# Patient Record
Sex: Male | Born: 1955 | Hispanic: Yes | Marital: Married | Smoking: Never smoker
Health system: Southern US, Community
[De-identification: ages and names within clinical notes are randomized; demographics above are authoritative.]

## PROBLEM LIST (undated history)

## (undated) DIAGNOSIS — G4733 Obstructive sleep apnea (adult) (pediatric): Secondary | ICD-10-CM

## (undated) DIAGNOSIS — E663 Overweight: Secondary | ICD-10-CM

## (undated) DIAGNOSIS — I10 Essential (primary) hypertension: Secondary | ICD-10-CM

## (undated) DIAGNOSIS — R12 Heartburn: Secondary | ICD-10-CM

## (undated) DIAGNOSIS — M1990 Unspecified osteoarthritis, unspecified site: Secondary | ICD-10-CM

## (undated) DIAGNOSIS — I4891 Unspecified atrial fibrillation: Secondary | ICD-10-CM

## (undated) DIAGNOSIS — G4730 Sleep apnea, unspecified: Secondary | ICD-10-CM

## (undated) DIAGNOSIS — R0602 Shortness of breath: Secondary | ICD-10-CM

## (undated) HISTORY — DX: Unspecified atrial fibrillation: I48.91

## (undated) HISTORY — DX: Shortness of breath: R06.02

## (undated) HISTORY — DX: Sleep apnea, unspecified: G47.30

## (undated) HISTORY — DX: Heartburn: R12

## (undated) HISTORY — DX: Unspecified osteoarthritis, unspecified site: M19.90

## (undated) HISTORY — DX: Overweight: E66.3

---

## 2007-07-12 ENCOUNTER — Encounter: Admission: RE | Admit: 2007-07-12 | Discharge: 2007-10-10 | Payer: Self-pay | Admitting: Neurosurgery

## 2007-10-11 ENCOUNTER — Encounter: Admission: RE | Admit: 2007-10-11 | Discharge: 2008-01-09 | Payer: Self-pay | Admitting: Neurosurgery

## 2019-04-11 ENCOUNTER — Ambulatory Visit: Payer: Self-pay

## 2019-04-11 ENCOUNTER — Ambulatory Visit (INDEPENDENT_AMBULATORY_CARE_PROVIDER_SITE_OTHER): Payer: Managed Care, Other (non HMO) | Admitting: Orthopaedic Surgery

## 2019-04-11 ENCOUNTER — Other Ambulatory Visit: Payer: Self-pay

## 2019-04-11 DIAGNOSIS — G8929 Other chronic pain: Secondary | ICD-10-CM | POA: Diagnosis not present

## 2019-04-11 DIAGNOSIS — M1712 Unilateral primary osteoarthritis, left knee: Secondary | ICD-10-CM | POA: Diagnosis not present

## 2019-04-11 DIAGNOSIS — M25561 Pain in right knee: Secondary | ICD-10-CM

## 2019-04-11 DIAGNOSIS — M1711 Unilateral primary osteoarthritis, right knee: Secondary | ICD-10-CM | POA: Diagnosis not present

## 2019-04-11 DIAGNOSIS — M25562 Pain in left knee: Secondary | ICD-10-CM | POA: Diagnosis not present

## 2019-04-11 MED ORDER — LIDOCAINE HCL 1 % IJ SOLN
3.0000 mL | INTRAMUSCULAR | Status: AC | PRN
Start: 2019-04-11 — End: 2019-04-11
  Administered 2019-04-11: 3 mL

## 2019-04-11 MED ORDER — LIDOCAINE HCL 1 % IJ SOLN
3.0000 mL | INTRAMUSCULAR | Status: AC | PRN
  Administered 2019-04-11: 3 mL

## 2019-04-11 MED ORDER — METHYLPREDNISOLONE ACETATE 40 MG/ML IJ SUSP
40.0000 mg | INTRAMUSCULAR | Status: AC | PRN
  Administered 2019-04-11: 40 mg via INTRA_ARTICULAR

## 2019-04-11 MED ORDER — METHYLPREDNISOLONE ACETATE 40 MG/ML IJ SUSP
40.0000 mg | INTRAMUSCULAR | Status: AC | PRN
Start: 2019-04-11 — End: 2019-04-11
  Administered 2019-04-11: 40 mg via INTRA_ARTICULAR

## 2019-04-11 NOTE — Progress Notes (Signed)
Office Visit Note   Patient: Daryl [name]           Date of Birth: [birth year]           MRN: [id number] Visit Date: 04/11/2019              Requested by: No referring provider defined for this encounter. PCP: System, Pcp Not In   Assessment & Plan: Visit Diagnoses:  1. Chronic pain of left knee   2. Chronic pain of right knee   3. Unilateral primary osteoarthritis, left knee   4. Unilateral primary osteoarthritis, right knee     Plan: He understands fully that he has significant arthritis in both his knees.  We are going to try conservative treatment first with steroid injections in both his knees followed by hyaluronic acid in both knees in about a month from now.  He agrees with trying this treatment plan at first.  All question concerns were answered and addressed.  We will see him back in 4 weeks for hyaluronic acid injections in both knees.  He did tolerate the steroids in both knees today.  Follow-Up Instructions: Return in about 4 weeks (around 05/09/2019).   Orders:  Orders Placed This Encounter  Procedures  . Large Joint Inj: R knee  . Large Joint Inj: L knee  . XR Knee 1-2 Views Left  . XR Knee 1-2 Views Right   No orders of the defined types were placed in this encounter.     Procedures: Large Joint Inj: R knee on 04/11/2019 4:31 PM Indications: diagnostic evaluation and pain Details: 22 G 1.5 in needle, superolateral approach  Arthrogram: No  Medications: 3 mL lidocaine 1 %; 40 mg methylPREDNISolone acetate 40 MG/ML Outcome: tolerated well, no immediate complications Procedure, treatment alternatives, risks and benefits explained, specific risks discussed. Consent was given by the patient. Immediately prior to procedure a time out was called to verify the correct patient, procedure, equipment, support staff and site/side marked as required. Patient was prepped and draped in the usual sterile fashion.   Large Joint Inj: L knee on 04/11/2019 4:31  PM Indications: diagnostic evaluation and pain Details: 22 G 1.5 in needle, superolateral approach  Arthrogram: No  Medications: 3 mL lidocaine 1 %; 40 mg methylPREDNISolone acetate 40 MG/ML Outcome: tolerated well, no immediate complications Procedure, treatment alternatives, risks and benefits explained, specific risks discussed. Consent was given by the patient. Immediately prior to procedure a time out was called to verify the correct patient, procedure, equipment, support staff and site/side marked as required. Patient was prepped and draped in the usual sterile fashion.       Clinical Data: No additional findings.   Subjective: Chief Complaint  Patient presents with  . Left Knee - Pain  . Right Knee - Pain  The patient is a very pleasant 63 year old gentleman who is seen with an interpreter today.  He speaks mainly [place] but he does speak broken [place].  He has been dealing with bilateral knee pain for several years now.  He says he has flareups off and on but they are not constant.  His knees pop and crack on him quite a bit.  He denies any swelling.  He has never had any type of surgery for his knees.  He has never had any type of injections.  He does have a family history of a brother who has knee replacements.  His brother has the same problems in terms of the varus position of his  knees which I have described to him and we can see on his exam and he says his brother has the same thing where his knee is turning.  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  He is not a diabetic.  HPI  Review of Systems He currently denies any headache, chest pain, shortness of breath, fever, chills, nausea, vomiting  Objective: Vital Signs: There were no vitals taken for this visit.  Physical Exam He is alert and orient x3 and in no acute distress Ortho Exam Examination of both knees shows varus malalignment.  Both knees have  patellofemoral crepitation and medial joint line tenderness.  Both knees are ligamentously stable with full range of motion.  Neither knee has an effusion. Specialty Comments:  No specialty comments available.  Imaging: XR Knee 1-2 Views Left  Result Date: 04/11/2019 2 views of the left knee show severe tricompartmental arthritic changes with medial joint space narrowing, varus malalignment and periarticular osteophytes in all 3 compartments.  There is significant patellofemoral arthritic changes.  XR Knee 1-2 Views Right  Result Date: 04/11/2019 2 views of the right knee show tricompartment arthritic changes with complete loss of medial joint space.  There is varus malalignment.  There is significant patellofemoral arthritic changes and osteophytes in all 3 compartments.    PMFS History: Patient Active Problem List   Diagnosis Date Noted  . Unilateral primary osteoarthritis, left knee 04/11/2019  . Unilateral primary osteoarthritis, right knee 04/11/2019   No past medical history on file.  No family history on file.   Social History   Occupational History  . Not on file  Tobacco Use  . Smoking status: Not on file  Substance and Sexual Activity  . Alcohol use: Not on file  . Drug use: Not on file  . Sexual activity: Not on file

## 2019-04-19 ENCOUNTER — Ambulatory Visit: Payer: Self-pay | Admitting: Orthopaedic Surgery

## 2019-05-09 ENCOUNTER — Encounter: Payer: Self-pay | Admitting: Orthopaedic Surgery

## 2019-05-09 ENCOUNTER — Ambulatory Visit: Payer: Managed Care, Other (non HMO) | Admitting: Orthopaedic Surgery

## 2019-05-09 ENCOUNTER — Other Ambulatory Visit: Payer: Self-pay

## 2019-05-09 DIAGNOSIS — M1711 Unilateral primary osteoarthritis, right knee: Secondary | ICD-10-CM | POA: Diagnosis not present

## 2019-05-09 DIAGNOSIS — M1712 Unilateral primary osteoarthritis, left knee: Secondary | ICD-10-CM

## 2019-05-09 MED ORDER — HYALURONAN 88 MG/4ML IX SOSY
88.0000 mg | PREFILLED_SYRINGE | INTRA_ARTICULAR | Status: AC | PRN
  Administered 2019-05-09: 88 mg via INTRA_ARTICULAR

## 2019-05-09 MED ORDER — LIDOCAINE HCL 1 % IJ SOLN
3.0000 mL | INTRAMUSCULAR | Status: AC | PRN
  Administered 2019-05-09: 3 mL

## 2019-05-09 NOTE — Progress Notes (Signed)
   Procedure Note  Patient: Daryl [name]             Date of Birth: [birth year]           MRN: [id number]             Visit Date: 05/09/2019  Procedures: Visit Diagnoses:  1. Unilateral primary osteoarthritis, left knee   2. Unilateral primary osteoarthritis, right knee     Large Joint Inj: R knee on 05/09/2019 5:01 PM Indications: diagnostic evaluation and pain Details: 22 G 1.5 in needle, superolateral approach  Arthrogram: No  Medications: 88 mg Hyaluronan 88 MG/4ML Outcome: tolerated well, no immediate complications Procedure, treatment alternatives, risks and benefits explained, specific risks discussed. Consent was given by the patient. Immediately prior to procedure a time out was called to verify the correct patient, procedure, equipment, support staff and site/side marked as required. Patient was prepped and draped in the usual sterile fashion.   Large Joint Inj: L knee on 05/09/2019 5:01 PM Indications: diagnostic evaluation and pain Details: 22 G 1.5 in needle, superolateral approach  Arthrogram: No  Medications: 3 mL lidocaine 1 %; 88 mg Hyaluronan 88 MG/4ML Outcome: tolerated well, no immediate complications Procedure, treatment alternatives, risks and benefits explained, specific risks discussed. Consent was given by the patient. Immediately prior to procedure a time out was called to verify the correct patient, procedure, equipment, support staff and site/side marked as required. Patient was prepped and draped in the usual sterile fashion.    The patient is here today for scheduled hyaluronic acid injections in both knees with Monovisc to treat the pain from osteoarthritis.  Other forms of conservative treatment including steroid injections have been tried.  He does weigh 260 pounds and knows he needs to lose weight because this will help decrease the pain of his knees.  He is already lost 6 pounds and he says that is actually helped.  We did speak through an interpreter.   We talked about him getting back into his gym.  He wants to do the exercise bike.  I did show him quad exercises to try as well.  I did place Monovisc in both knees without difficulty.  All questions concerns were answered and addressed.  Follow-up is as needed.

## 2019-10-16 ENCOUNTER — Telehealth: Payer: Self-pay | Admitting: Orthopedic Surgery

## 2019-10-16 NOTE — Telephone Encounter (Signed)
Mr. Birdsell had bilateral monovisc injections on 05/09/19.  He would like to know how soon he can get them again.  He has a trip planned in September and is hoping to have the injections before then.  Please call his wife, Joni Reining to discuss [phone].

## 2019-10-16 NOTE — Telephone Encounter (Signed)
Mr. Caine had bilateral monovisc injections on 05/09/19.  He would like to know how soon he can get them again.  He has a trip planned in September and is hoping to have the injections before then.  Please call his wife, Nicole to discuss [phone]. 

## 2019-10-17 NOTE — Telephone Encounter (Signed)
Noted  

## 2019-10-17 NOTE — Telephone Encounter (Signed)
Patient states he would like to have injections again the 1st of September right before they go on a trip  I told him I would give you this information

## 2019-10-19 ENCOUNTER — Telehealth: Payer: Self-pay

## 2019-10-19 NOTE — Telephone Encounter (Signed)
Submitted VOB, Monovisc, bilateral knee. 

## 2019-10-22 ENCOUNTER — Other Ambulatory Visit (HOSPITAL_BASED_OUTPATIENT_CLINIC_OR_DEPARTMENT_OTHER): Payer: Self-pay

## 2019-10-22 DIAGNOSIS — G4700 Insomnia, unspecified: Secondary | ICD-10-CM

## 2019-10-22 DIAGNOSIS — G4710 Hypersomnia, unspecified: Secondary | ICD-10-CM

## 2019-10-22 DIAGNOSIS — R0683 Snoring: Secondary | ICD-10-CM

## 2019-10-22 DIAGNOSIS — R0681 Apnea, not elsewhere classified: Secondary | ICD-10-CM

## 2019-11-05 ENCOUNTER — Ambulatory Visit (HOSPITAL_BASED_OUTPATIENT_CLINIC_OR_DEPARTMENT_OTHER): Payer: Managed Care, Other (non HMO) | Attending: Family Medicine | Admitting: Cardiology

## 2019-11-05 ENCOUNTER — Other Ambulatory Visit: Payer: Self-pay

## 2019-11-05 DIAGNOSIS — R0902 Hypoxemia: Secondary | ICD-10-CM | POA: Diagnosis not present

## 2019-11-05 DIAGNOSIS — G4700 Insomnia, unspecified: Secondary | ICD-10-CM | POA: Diagnosis not present

## 2019-11-05 DIAGNOSIS — G4710 Hypersomnia, unspecified: Secondary | ICD-10-CM | POA: Insufficient documentation

## 2019-11-05 DIAGNOSIS — R0683 Snoring: Secondary | ICD-10-CM | POA: Insufficient documentation

## 2019-11-05 DIAGNOSIS — G4736 Sleep related hypoventilation in conditions classified elsewhere: Secondary | ICD-10-CM | POA: Diagnosis not present

## 2019-11-05 DIAGNOSIS — G4733 Obstructive sleep apnea (adult) (pediatric): Secondary | ICD-10-CM | POA: Diagnosis present

## 2019-11-05 DIAGNOSIS — R0681 Apnea, not elsewhere classified: Secondary | ICD-10-CM

## 2019-11-06 ENCOUNTER — Telehealth: Payer: Self-pay

## 2019-11-06 NOTE — Telephone Encounter (Signed)
PA required for Monovisc, bilateral knee. Faxed completed PA to [employer] at [phone].

## 2019-11-07 NOTE — Procedures (Signed)
   Patient Name: Daryl [name], Daryl [name] Date: 11/05/2019 Gender: Male D.O.B: [birth year] Age (years): 48 Referring Provider: Isidor Holts NP Height (inches): 71 Interpreting Physician: Armanda Magic MD, ABSM Weight (lbs): 260 RPSGT: Galt Sink BMI: 36 MRN: [id number] Neck Size: 17.00  CLINICAL INFORMATION Sleep Study Type: HST  Indication for sleep study: N/A  Epworth Sleepiness Score: 19  SLEEP STUDY TECHNIQUE A multi-channel overnight portable sleep study was performed. The channels recorded were: nasal airflow, thoracic respiratory movement, and oxygen saturation with a pulse oximetry. Snoring was also monitored.  MEDICATIONS Patient self administered medications include: N/A.  SLEEP ARCHITECTURE Patient was studied for 548 minutes. The sleep efficiency was 100.0 % and the patient was supine for 0%. The arousal index was 0.0 per hour.  RESPIRATORY PARAMETERS The overall AHI was 63.1 per hour, with a central apnea index of 0.0 per hour.  The oxygen nadir was 43% during sleep.  CARDIAC DATA Mean heart rate during sleep was 64.6 bpm.  IMPRESSIONS - Severe obstructive sleep apnea occurred during this study (AHI = 63.1/h). - No significant central sleep apnea occurred during this study (CAI = 0.0/h). - Severe oxygen desaturation was noted during this study (Min O2 = 43%). - Patient snored 9.2% during the sleep.  DIAGNOSIS - Obstructive Sleep Apnea (G47.33) - Nocturnal Hypoxemia (G47.36)  RECOMMENDATIONS - Recommend in lab CPAP titration due to severity of sleep disordered breathing and hypoxemia.  - Avoid alcohol, sedatives and other CNS depressants that may worsen sleep apnea and disrupt normal sleep architecture. - Sleep hygiene should be reviewed to assess factors that may improve sleep quality. - Weight management and regular exercise should be initiated or continued.  [Electronically signed] 11/07/2019 07:22 PM  Armanda Magic MD, ABSM Diplomate, American Board  of Sleep Medicine

## 2019-11-13 ENCOUNTER — Telehealth: Payer: Self-pay | Admitting: *Deleted

## 2019-11-13 NOTE — Telephone Encounter (Signed)
Informed patient of sleep study results and patient understanding was verbalized. Patient understandshissleep study showed they have sleep apnea and recommend CPAP titration. Please set up titration in the sleep lab. Titration sent to sleep pool. Pt is aware and agreeable tohisresults. 

## 2019-11-13 NOTE — Telephone Encounter (Signed)
-----   Message from Quintella Reichert, MD sent at 11/07/2019  7:24 PM EDT ----- Please let patient know that they have sleep apnea and recommend CPAP titration. Please set up titration in the sleep lab.

## 2019-11-16 ENCOUNTER — Telehealth: Payer: Self-pay | Admitting: *Deleted

## 2019-11-16 NOTE — Telephone Encounter (Signed)
PA submitted via fax to  [employer] forCPAP titration study.

## 2019-11-23 ENCOUNTER — Telehealth: Payer: Self-pay

## 2019-11-23 NOTE — Telephone Encounter (Signed)
Called to check status of PA for Monovisc, bilateral knee. Currently no approval.  Refaxed PA form to [place] at [phone].

## 2019-11-23 NOTE — Telephone Encounter (Signed)
NOTES ON FILE FROM  [facility] [phone], SENT REFERRAL TO SCHEDULING

## 2019-11-27 ENCOUNTER — Telehealth: Payer: Self-pay

## 2019-11-27 NOTE — Telephone Encounter (Signed)
Called and left a VM for patient to call back to schedule an appointment with Dr. Magnus Ivan or Bronson Curb on 12/12/2019.  Approved, Monovisc, bilateral knee. [name] will be responsible for 30% OOP. No Co-pay PA required PA Approval# [id number] Valid 11/23/2019- 12/21/2019

## 2019-11-29 ENCOUNTER — Other Ambulatory Visit: Payer: Self-pay | Admitting: Orthopaedic Surgery

## 2019-11-29 DIAGNOSIS — M25532 Pain in left wrist: Secondary | ICD-10-CM

## 2019-12-04 ENCOUNTER — Ambulatory Visit
Admission: RE | Admit: 2019-12-04 | Discharge: 2019-12-04 | Disposition: A | Discharge status: Home / Self Care | Payer: Managed Care, Other (non HMO) | Source: Ambulatory Visit | Attending: Orthopaedic Surgery | Admitting: Orthopaedic Surgery

## 2019-12-04 DIAGNOSIS — M25532 Pain in left wrist: Secondary | ICD-10-CM

## 2019-12-13 ENCOUNTER — Other Ambulatory Visit: Payer: Managed Care, Other (non HMO)

## 2019-12-25 ENCOUNTER — Telehealth: Payer: Self-pay | Admitting: *Deleted

## 2019-12-25 NOTE — Telephone Encounter (Signed)
Staff message sent to Daryl [name] CPAP titration denied by [place].

## 2019-12-26 ENCOUNTER — Telehealth: Payer: Self-pay | Admitting: *Deleted

## 2019-12-26 DIAGNOSIS — G4733 Obstructive sleep apnea (adult) (pediatric): Secondary | ICD-10-CM

## 2019-12-26 NOTE — Telephone Encounter (Signed)
-----   Message from Quintella Reichert, MD sent at 12/25/2019 11:51 AM EDT ----- Regarding: RE: TITRATION Please order a ResMed CPAP on auto from 4-20cm H2O with heated humidity and mask of choice.  Please get an overnight pulse ox on CPAP.  FOllowup with me in 8 weeks  Traci ----- Message ----- From: Reesa Chew, CMA Sent: 12/25/2019  11:23 AM EDT To: Quintella Reichert, MD Subject: RE: TITRATION                                  Insurance denied titration need Apap asap insurance running out. ----- Message ----- From: Quintella Reichert, MD Sent: 12/21/2019   8:50 AM EDT To: Reesa Chew, CMA Subject: RE: TITRATION                                  Please call insurance company to find out what is going on ----- Message ----- From: Reesa Chew, CMA Sent: 12/20/2019   2:47 PM EDT To: Quintella Reichert, MD Subject: TITRATION                                      Patient is waiting for authorization for a cpap titration to come back that has been gone since 11/16/19. He called to say he will lose his insurance on 01/16/20 and will not be ale to pay for it. Would you consider ordering a APAP so he can go ahead and get a cpap machine?  Thanks, Coralee North

## 2019-12-26 NOTE — Telephone Encounter (Signed)
Order placed to [facility]. ° ° DME selection is [facility]. °Patient understands he will be contacted by [facility] to set up his cpap. °Patient understands to call if [facility] does not contact him with new setup in a timely manner. °Patient understands they will be called once confirmation has been received from [facility] that they have received their new machine to schedule 10 week follow up appointment. ° °[facility] notified of new cpap order  °Please add to airview °Patient was grateful for the call and thanked me. °

## 2019-12-26 NOTE — Telephone Encounter (Signed)
Daryl [name], CMA  Reesa Chew, CMA CPAP titration denied by [facility]. This is the person that needs APAP Asap. Lol!

## 2019-12-27 NOTE — Telephone Encounter (Deleted)
Please order a ResMed CPAP on auto from 4-20cm H2O with heated humidity and mask of choice. Please get an overnight pulse ox on CPAP. FOllowup with me in 8 weeks

## 2020-06-24 ENCOUNTER — Emergency Department (HOSPITAL_COMMUNITY): Payer: BC Managed Care – PPO

## 2020-06-24 ENCOUNTER — Other Ambulatory Visit: Payer: Self-pay

## 2020-06-24 ENCOUNTER — Encounter (HOSPITAL_COMMUNITY): Payer: Self-pay | Admitting: Emergency Medicine

## 2020-06-24 ENCOUNTER — Emergency Department (HOSPITAL_COMMUNITY)
Admission: EM | Admit: 2020-06-24 | Discharge: 2020-06-25 | Disposition: A | Discharge status: Home / Self Care | Payer: BC Managed Care – PPO | Attending: Emergency Medicine | Admitting: Emergency Medicine

## 2020-06-24 DIAGNOSIS — R079 Chest pain, unspecified: Secondary | ICD-10-CM | POA: Diagnosis present

## 2020-06-24 DIAGNOSIS — I4891 Unspecified atrial fibrillation: Secondary | ICD-10-CM | POA: Diagnosis not present

## 2020-06-24 DIAGNOSIS — R52 Pain, unspecified: Secondary | ICD-10-CM

## 2020-06-24 LAB — CBC
HCT: 47 % (ref 39.0–52.0)
Hemoglobin: 15.4 g/dL (ref 13.0–17.0)
MCH: 29.8 pg (ref 26.0–34.0)
MCHC: 32.8 g/dL (ref 30.0–36.0)
MCV: 90.9 fL (ref 80.0–100.0)
Platelets: 218 10*3/uL (ref 150–400)
RBC: 5.17 MIL/uL (ref 4.22–5.81)
RDW: 12.9 % (ref 11.5–15.5)
WBC: 10.5 10*3/uL (ref 4.0–10.5)
nRBC: 0 % (ref 0.0–0.2)

## 2020-06-24 LAB — BASIC METABOLIC PANEL
Anion gap: 10 (ref 5–15)
BUN: 20 mg/dL (ref 8–23)
CO2: 27 mmol/L (ref 22–32)
Calcium: 9.6 mg/dL (ref 8.9–10.3)
Chloride: 104 mmol/L (ref 98–111)
Creatinine, Ser: 1.35 mg/dL — ABNORMAL HIGH (ref 0.61–1.24)
GFR, Estimated: 59 mL/min — ABNORMAL LOW (ref >60)
Glucose, Bld: 116 mg/dL — ABNORMAL HIGH (ref 70–99)
Potassium: 3.9 mmol/L (ref 3.5–5.1)
Sodium: 141 mmol/L (ref 135–145)

## 2020-06-24 LAB — MAGNESIUM: Magnesium: 2 mg/dL (ref 1.7–2.4)

## 2020-06-24 LAB — TSH: TSH: 1.86 u[IU]/mL (ref 0.350–4.500)

## 2020-06-24 LAB — TROPONIN I (HIGH SENSITIVITY)
Troponin I (High Sensitivity): 9 ng/L (ref <18)
Troponin I (High Sensitivity): 7 ng/L (ref <18)

## 2020-06-24 MED ORDER — FENTANYL CITRATE (PF) 100 MCG/2ML IJ SOLN
50.0000 ug | Freq: Once | INTRAMUSCULAR | Status: AC
  Administered 2020-06-24: 50 ug via INTRAVENOUS
  Filled 2020-06-24: qty 2

## 2020-06-24 MED ORDER — ETOMIDATE 2 MG/ML IV SOLN
10.0000 mg | Freq: Once | INTRAVENOUS | Status: AC
  Administered 2020-06-24: 10 mg via INTRAVENOUS
  Filled 2020-06-24: qty 10

## 2020-06-24 MED ORDER — APIXABAN 5 MG PO TABS: 5.0000 mg | ORAL_TABLET | Freq: Two times a day (BID) | ORAL | 0 refills | Status: DC

## 2020-06-24 NOTE — ED Notes (Signed)
Time out performed

## 2020-06-24 NOTE — Telephone Encounter (Signed)
DME changed to [facility] for in network benefits. Fax came from [place] that they have not been not able to contact the patient for set up. 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 He says he really needs his machine and he will call the sleep department as soon as his benefits come available on this new job. At that time I will call back to get him set up.

## 2020-06-24 NOTE — ED Notes (Signed)
Consent signed and placed in medical records folder

## 2020-06-24 NOTE — ED Provider Notes (Addendum)
[facility]-EMERGENCY DEPT Provider Note   CSN: [id number] Arrival date & time: 06/24/20  2006     History Chief Complaint  Patient presents with  . Chest Pain    Daryl [name] is a 65 y.o. male with past medical history of hypertension thatresents the emerge department today for chest pain that started around 5 PM.  Patient is Spanish-speaking, wife was used as Equities trader.  Patient states that he was feeling fine this morning and yesterday, started having chest pressure around 5 PM, took his blood pressure and noticed that his systolic was 150s, called his wife and they came to the emergency department and was noted to be in A. fib.  Chest pressure did not radiate anywhere, was not initiated upon exertion.  Patient denies any shortness of breath, nausea, vomiting, fevers.  Denies any recent illnesses.  Denies any history of heart failure.  Was in his normal health before this.  No recent medication changes.  Denies any leg swelling, states that he cannot feel any palpitations however had a sudden onset chest pain at 5 PM.  States it is never happened to him before.  No history of A. fib.  No alcohol or drug use.  Has been compliant with his blood pressure medication.  Is not on any anticoagulants. HPI     History reviewed. No pertinent past medical history.  Patient Active Problem List   Diagnosis Date Noted  . Unilateral primary osteoarthritis, left knee 04/11/2019  . Unilateral primary osteoarthritis, right knee 04/11/2019    History reviewed. No pertinent surgical history.     History reviewed. No pertinent family history.  Social History   Tobacco Use  . Smoking status: Never Smoker  . Smokeless tobacco: Never Used  Vaping Use  . Vaping Use: Never used  Substance Use Topics  . Alcohol use: Not Currently  . Drug use: Not Currently    Home Medications Prior to Admission medications   Medication Sig Start Date End Date Taking? Authorizing Provider   apixaban (ELIQUIS) 5 MG TABS tablet Take 1 tablet (5 mg total) by mouth 2 (two) times daily. 06/24/20 07/24/20 Yes , , PA-C  Cyanocobalamin (VITAMIN B 12 PO) Take 1 tablet by mouth daily.   Yes [provider]  fluticasone (FLONASE) 50 MCG/ACT nasal spray Place 1 spray into both nostrils daily as needed for allergies. 04/11/20  Yes [provider]  lisinopril-hydrochlorothiazide (ZESTORETIC) 20-12.5 MG tablet Take 1 tablet by mouth daily. 03/31/20  Yes [provider]  NEOMYCIN-POLYMYXIN-HYDROCORTISONE (CORTISPORIN) 1 % SOLN OTIC solution Place 1 drop into both ears daily as needed (itching). 04/11/20  Yes [provider]    Allergies    Patient has no known allergies.  Review of Systems   Review of Systems  Constitutional: Negative for chills, diaphoresis, fatigue and fever.  HENT: Negative for congestion, sore throat and trouble swallowing.   Eyes: Negative for pain and visual disturbance.  Respiratory: Negative for cough, shortness of breath and wheezing.   Cardiovascular: Positive for chest pain. Negative for palpitations and leg swelling.  Gastrointestinal: Negative for abdominal distention, abdominal pain, diarrhea, nausea and vomiting.  Genitourinary: Negative for difficulty urinating.  Musculoskeletal: Negative for back pain, neck pain and neck stiffness.  Skin: Negative for pallor.  Neurological: Negative for dizziness, speech difficulty, weakness and headaches.  Psychiatric/Behavioral: Negative for confusion.    Physical Exam Updated Vital Signs BP (!) 152/108   Pulse 98   Temp 98.6 F (37 C) (Oral)  Resp 20   Ht 5\' 10"  (1.778 m)   Wt 113.4 kg   SpO2 94%   BMI 35.87 kg/m   Physical Exam Constitutional:      General: He is not in acute distress.    Appearance: Normal appearance. He is not ill-appearing, toxic-appearing or diaphoretic.  HENT:     Mouth/Throat:     Mouth: Mucous membranes are moist.     Pharynx:  Oropharynx is clear.  Eyes:     General: No scleral icterus.    Extraocular Movements: Extraocular movements intact.     Pupils: Pupils are equal, round, and reactive to light.  Cardiovascular:     Rate and Rhythm: Tachycardia present. Rhythm irregular.     Pulses: Normal pulses.     Heart sounds: Normal heart sounds.  Pulmonary:     Effort: Pulmonary effort is normal. No respiratory distress.     Breath sounds: Normal breath sounds. No stridor. No wheezing, rhonchi or rales.  Chest:     Chest wall: No tenderness.  Abdominal:     General: Abdomen is flat. There is no distension.     Palpations: Abdomen is soft.     Tenderness: There is no abdominal tenderness. There is no guarding or rebound.  Musculoskeletal:        General: No swelling or tenderness. Normal range of motion.     Cervical back: Normal range of motion and neck supple. No rigidity.     Right lower leg: No edema.     Left lower leg: No edema.  Skin:    General: Skin is warm and dry.     Capillary Refill: Capillary refill takes less than 2 seconds.     Coloration: Skin is not pale.  Neurological:     General: No focal deficit present.     Mental Status: He is alert and oriented to person, place, and time.  Psychiatric:        Mood and Affect: Mood is anxious.        Behavior: Behavior normal.     ED Results / Procedures / Treatments   Labs (all labs ordered are listed, but only abnormal results are displayed) Labs Reviewed  BASIC METABOLIC PANEL - Abnormal; Notable for the following components:      Result Value   Glucose, Bld 116 (*)    Creatinine, Ser 1.35 (*)    GFR, Estimated 59 (*)    All other components within normal limits  CBC  MAGNESIUM  TSH  TROPONIN I (HIGH SENSITIVITY)  TROPONIN I (HIGH SENSITIVITY)    EKG EKG Interpretation  Date/Time:  Tuesday June 24 2020 20:13:14 EDT Ventricular Rate:  135 PR Interval:    QRS Duration: 110 QT Interval:  313 QTC Calculation: 470 R  Axis:   70 Text Interpretation: Atrial fibrillation 12 Lead; Mason-Likar No old tracing to compare Confirmed by 11-03-2001 [phone]) on 06/24/2020 8:35:12 PM   Radiology DG Chest Port 1 View  Result Date: 06/24/2020 CLINICAL DATA:  Chest pain. EXAM: PORTABLE CHEST 1 VIEW COMPARISON:  None. FINDINGS: Mild, diffusely increased interstitial lung markings are seen. There is no evidence of focal consolidation, pleural effusion or pneumothorax. The cardiac silhouette is borderline in size. Degenerative changes seen throughout the thoracic spine. IMPRESSION: Increased interstitial lung markings which may be, in part, chronic in nature. A mild superimposed component of interstitial edema cannot be excluded. Electronically Signed   By: 06/26/2020 M.D.   On: 06/24/2020 20:53  Procedures .Cardioversion  Date/Time: 06/24/2020 11:03 PM Performed by: Farrel Gordon, PA-C Authorized by: Farrel Gordon, PA-C   Consent:    Consent obtained:  Verbal   Consent given by:  Patient and spouse   Risks discussed:  Cutaneous burn, death, induced arrhythmia and pain   Alternatives discussed:  Rate-control medication Pre-procedure details:    Cardioversion basis:  Emergent   Rhythm:  Atrial fibrillation Patient sedated: Yes. Refer to sedation procedure documentation for details of sedation.  Attempt one:    Cardioversion mode:  Synchronous   Shock (Joules):  150   Shock outcome:  Conversion to normal sinus rhythm Post-procedure details:    Patient status:  Awake   Patient tolerance of procedure:  Tolerated well, no immediate complications     Medications Ordered in ED Medications  etomidate (AMIDATE) injection 10 mg (10 mg Intravenous Given 06/24/20 2121)  fentaNYL (SUBLIMAZE) injection 50 mcg (50 mcg Intravenous Given 06/24/20 2108)    ED Course  I have reviewed the triage vital signs and the nursing notes.  Pertinent labs & imaging results that were available during my care of the  patient were reviewed by me and considered in my medical decision making (see chart for details).    MDM Rules/Calculators/A&P     CHA2DS2-VASc Score: 1                      Daryl [name] is a 65 y.o. male with past medical history of hypertension thatresents the emerge department today for chest pain that started around 5 PM.  Patient in A. fib with RVR, hemodynamically stable.  Patient with clear onset around 5 PM, has never had anything like this before.  Discussed options with patient and wife about cardioversion versus rate control medications.  They opted for cardioversion.  No clear-cut reason for why patient went into A. Fib.  Cardioversion successful with 150 J, patient states that he is completely asymptomatic now.  Work-up today benign with unremarkable CBC, BMP, magnesium.  Two negative flat troponins.  TSH normal.  CHA2DS2-VASc 1, however with cardioversion will place on anticoagulant.  Discussed risks of this.  Patient is now been observed for 2 hours after cardioversion, has been in normal sinus rhythm and pain-free. Low concerns for ACS. HEAR scire 3. Patient to be discharged home with referral to A. fib clinic, patient will follow up with PCP.  Strict return precautions given, wife and patient agreeable.   Doubt need for further emergent work up at this time. I explained the diagnosis and have given explicit precautions to return to the ER including for any other new or worsening symptoms. The patient understands and accepts the medical plan as it's been dictated and I have answered their questions. Discharge instructions concerning home care and prescriptions have been given. The patient is STABLE and is discharged to home in good condition.  I discussed this case with my attending physician who cosigned this note including patient's presenting symptoms, physical exam, and planned diagnostics and interventions. Attending physician stated agreement with plan or made changes to plan  which were implemented.   Attending physician assessed patient at bedside.  Final Clinical Impression(s) / ED Diagnoses Final diagnoses:  Atrial fibrillation with RVR (HCC)    Rx / DC Orders ED Discharge Orders         Ordered    apixaban (ELIQUIS) 5 MG TABS tablet  2 times daily        06/24/20 2229  Amb referral to AFIB Clinic        06/24/20 2231               Farrel Gordonatel, , PA-C 06/24/20 2342    Benjiman CorePickering, Nathan, MD 06/25/20 [phone]

## 2020-06-24 NOTE — ED Triage Notes (Signed)
Patient complaining of  Mid chest pain that started two hours ago. Patient states there is nothing that is relieving it. Patient blood pressure is high.

## 2020-06-24 NOTE — ED Notes (Signed)
Dr. Rubin Payor at bedside with Daryl [name], [place] for cardioversion for afib with RVR

## 2020-06-24 NOTE — ED Notes (Signed)
Repeat EKG performed.   

## 2020-06-24 NOTE — ED Notes (Signed)
Pt is awake and talking after procedure. No complaints at present

## 2020-06-24 NOTE — ED Notes (Signed)
MD at bedside discussing procedure with pt

## 2020-06-24 NOTE — Discharge Instructions (Addendum)
Te vieron hoy por A. fib, que te devolvieron el ritmo con Furniture conservator/restorer. Deber comenzar a tomar Eliquis, este es un anticoagulante como lo comentamos. Hay riesgos para esto, como sangrado, abstenerse de aspirina y [place]. Utilice las instrucciones adjuntas, quiero que haga un seguimiento con su atencin primaria con respecto a la visita de [employer]. Tambin quiero que haga un seguimiento de su presin arterial elevada. Si vuelve a tener los sntomas, vuelva al departamento de Sports administrator. Hable con su farmacutico acerca de todos los efectos secundarios de [place]. Tambin debe recibir una llamada telefnica de la clnica A. fib, programe una cita con ellos.  You were seen today for A. fib, which you were shocked back into rhythm.  You will have to start taking Eliquis, this is a blood thinner as we discussed.  There are risks to this such as bleeding, refrain from aspirin and other NSAIDs.  Please use the attached instructions, I want you to follow-up with your primary care in regards to today's visit in the next couple of days.  I also want you to follow-up about your elevated blood pressure.  If you start having the symptoms again, please come back to the emergency department.  Please speak to your pharmacist about all side effects to this medication.  You should also be getting a phone call from the A. fib clinic, please schedule appointment with them.

## 2020-06-25 NOTE — ED Provider Notes (Signed)
  Physical Exam  BP (!) 147/104   Pulse 94   Temp 98.7 F (37.1 C) (Oral)   Resp (!) 24   Ht 5\' 10"  (1.778 m)   Wt 113.4 kg   SpO2 91%   BMI 35.87 kg/m   Physical Exam  ED Course/Procedures     .Sedation  Date/Time: 06/24/2020 9:00 PM Performed by: 06/26/2020, MD Authorized by: Benjiman Core, MD   Consent:    Consent obtained:  Verbal   Consent given by:  Patient   Risks discussed:  Allergic reaction, dysrhythmia, inadequate sedation, nausea, vomiting, prolonged hypoxia resulting in organ damage and respiratory compromise necessitating ventilatory assistance and intubation Universal protocol:    Immediately prior to procedure, a time out was called: yes     Patient identity confirmed:  Arm band and verbally with patient Indications:    Procedure performed:  Cardioversion Pre-sedation assessment:    Time since last food or drink:  6   ASA classification: class 2 - patient with mild systemic disease     Mallampati score:  II - soft palate, uvula, fauces visible   Neck mobility: normal     Pre-sedation assessments completed and reviewed: airway patency, cardiovascular function, hydration status, mental status, pain level, respiratory function and temperature     Pre-sedation assessments completed and reviewed: pre-procedure nausea and vomiting status not reviewed   Immediate pre-procedure details:    Reviewed: vital signs     Verified: bag valve mask available, emergency equipment available, intubation equipment available, IV patency confirmed and oxygen available   Procedure details (see MAR for exact dosages):    Preoxygenation:  Room air   Sedation:  Etomidate   Intended level of sedation: deep   Analgesia:  Fentanyl   Intra-procedure monitoring:  Blood pressure monitoring, cardiac monitor, frequent LOC assessments and frequent vital sign checks   Intra-procedure events: hypoxia     Intra-procedure management:  Supplemental oxygen   Total Provider sedation  time (minutes):  10 Post-procedure details:    Attendance: Constant attendance by certified staff until patient recovered     Recovery: Patient returned to pre-procedure baseline     Post-sedation assessments completed and reviewed: airway patency, cardiovascular function, hydration status, mental status, pain level and respiratory function     Post-sedation assessments completed and reviewed: post-procedure nausea and vomiting status not reviewed     Patient is stable for discharge or admission: yes     Procedure completion:  Tolerated well, no immediate complications Comments:     Mild hypoxia improved with nasal cannula oxygen and jaw thrust.           Benjiman Core, MD 06/25/20 2354

## 2020-07-01 ENCOUNTER — Encounter (HOSPITAL_COMMUNITY): Payer: Self-pay | Admitting: Physician Assistant

## 2020-07-01 ENCOUNTER — Ambulatory Visit (HOSPITAL_COMMUNITY)
Admission: RE | Admit: 2020-07-01 | Discharge: 2020-07-01 | Disposition: A | Discharge status: Home / Self Care | Payer: BC Managed Care – PPO | Source: Ambulatory Visit | Attending: Physician Assistant | Admitting: Physician Assistant

## 2020-07-01 ENCOUNTER — Telehealth: Payer: Self-pay | Admitting: Cardiology

## 2020-07-01 ENCOUNTER — Other Ambulatory Visit: Payer: Self-pay

## 2020-07-01 VITALS — BP 118/88 | HR 131 | Ht 70.0 in | Wt 268.2 lb

## 2020-07-01 DIAGNOSIS — E669 Obesity, unspecified: Secondary | ICD-10-CM | POA: Diagnosis not present

## 2020-07-01 DIAGNOSIS — Z6838 Body mass index (BMI) 38.0-38.9, adult: Secondary | ICD-10-CM | POA: Insufficient documentation

## 2020-07-01 DIAGNOSIS — I480 Paroxysmal atrial fibrillation: Secondary | ICD-10-CM | POA: Diagnosis not present

## 2020-07-01 DIAGNOSIS — Z7901 Long term (current) use of anticoagulants: Secondary | ICD-10-CM | POA: Diagnosis not present

## 2020-07-01 DIAGNOSIS — I10 Essential (primary) hypertension: Secondary | ICD-10-CM | POA: Diagnosis not present

## 2020-07-01 HISTORY — DX: Essential (primary) hypertension: I10

## 2020-07-01 HISTORY — DX: Obstructive sleep apnea (adult) (pediatric): G47.33

## 2020-07-01 MED ORDER — METOPROLOL SUCCINATE ER 25 MG PO TB24: 25.0000 mg | ORAL_TABLET | Freq: Every day | ORAL | 3 refills | Status: DC

## 2020-07-01 NOTE — Telephone Encounter (Signed)
Patient called with new insurance information.

## 2020-07-01 NOTE — Telephone Encounter (Signed)
Patient is requesting to speak with Daryl [name] regarding having a sleep study. Please return call when able.

## 2020-07-01 NOTE — Patient Instructions (Signed)
Start Metoprolol 25mg  once a day - Start today

## 2020-07-01 NOTE — Progress Notes (Signed)
Primary Care Physician: Pcp, No Primary Cardiologist: none Primary Electrophysiologist: none Referring Physician: [facility]   Daryl [name] is a 65 y.o. male with a history of HTN, OSA, and atrial fibrillation who presents for consultation in the [facility].  The patient was initially diagnosed with atrial fibrillation 06/24/20 after presenting to the ED with symptoms of chest presure. ECG showed afib with RVR. He underwent DCCV at that time and his symptoms resolved. Patient was started on Eliquis for a CHADS2VASC score of 1. Patient does have a diagnosis of severe OSA but has not yet had CPAP titration. He is back in afib today with symptoms of fatigue. There were no specific triggers that he could identify.   Today, he denies symptoms of palpitations, shortness of breath, orthopnea, PND, lower extremity edema, dizziness, presyncope, syncope, snoring, daytime somnolence, bleeding, or neurologic sequela. The patient is tolerating medications without difficulties and is otherwise without complaint today.    Atrial Fibrillation Risk Factors:  he does have symptoms or diagnosis of sleep apnea. he is not compliant with CPAP therapy. Needs titration he does not have a history of rheumatic fever. he does not have a history of alcohol use. The patient does not have a history of early familial atrial fibrillation or other arrhythmias.  he has a BMI of Body mass index is 38.48 kg/m.Marland Kitchen Filed Weights   07/01/20 1507  Weight: 121.7 kg    No family history on file.   Atrial Fibrillation Management history:  Previous antiarrhythmic drugs: none Previous cardioversions: 06/24/20 Previous ablations: none CHADS2VASC score: 1 Anticoagulation history: Eliquis   Past Medical History:  Diagnosis Date  . HTN (hypertension)   . OSA (obstructive sleep apnea)    No past surgical history on file.  Current Outpatient Medications  Medication Sig Dispense Refill   . acetaminophen (TYLENOL) 500 MG tablet Take 500 mg by mouth every 6 (six) hours as needed for mild pain or moderate pain.    Marland Kitchen apixaban (ELIQUIS) 5 MG TABS tablet Take 1 tablet (5 mg total) by mouth 2 (two) times daily. 60 tablet 0  . lisinopril-hydrochlorothiazide (ZESTORETIC) 20-12.5 MG tablet Take 1 tablet by mouth daily.    . NEOMYCIN-POLYMYXIN-HYDROCORTISONE (CORTISPORIN) 1 % SOLN OTIC solution Place 1 drop into both ears daily as needed (itching).     No current facility-administered medications for this encounter.    No Known Allergies  Social History   Socioeconomic History  . Marital status: Married    Spouse name: Not on file  . Number of children: Not on file  . Years of education: Not on file  . Highest education level: Not on file  Occupational History  . Not on file  Tobacco Use  . Smoking status: Never Smoker  . Smokeless tobacco: Never Used  Vaping Use  . Vaping Use: Never used  Substance and Sexual Activity  . Alcohol use: Not Currently  . Drug use: Not Currently  . Sexual activity: Not on file  Other Topics Concern  . Not on file  Social History Narrative  . Not on file   Social Determinants of Health   Financial Resource Strain: Not on file  Food Insecurity: Not on file  Transportation Needs: Not on file  Physical Activity: Not on file  Stress: Not on file  Social Connections: Not on file  Intimate Partner Violence: Not on file     ROS- All systems are reviewed and negative except as per the HPI  above.  Physical Exam: Vitals:   07/01/20 1507  BP: 118/88  Pulse: (!) 131  Weight: 121.7 kg  Height: 5\' 10"  (1.778 m)    GEN- The patient is a well appearing obese male, alert and oriented x 3 today.   Head- normocephalic, atraumatic Eyes-  Sclera clear, conjunctiva pink Ears- hearing intact Oropharynx- clear Neck- supple  Lungs- Clear to ausculation bilaterally, normal work of breathing Heart- irregular rate and rhythm, tachycardia, no  murmurs, rubs or gallops  GI- soft, NT, ND, + BS Extremities- no clubbing, cyanosis, or edema MS- no significant deformity or atrophy Skin- no rash or lesion Psych- euthymic mood, full affect Neuro- strength and sensation are intact  Wt Readings from Last 3 Encounters:  07/01/20 121.7 kg  06/24/20 113.4 kg  11/05/19 (!) 117.9 kg    EKG today demonstrates  Afib  Vent. rate 131 BPM PR interval * ms QRS duration 102 ms QT/QTc 308/454 ms  Epic records are reviewed at length today  CHA2DS2-VASc Score = 1  The patient's score is based upon: CHF History: No HTN History: Yes Diabetes History: No Stroke History: No Vascular Disease History: No Age Score: 0 Gender Score: 0      ASSESSMENT AND PLAN: 1. Paroxysmal Atrial Fibrillation (ICD10:  I48.0) The patient's CHA2DS2-VASc score is 1, indicating a 0.6% annual risk of stroke.   General education about afib provided and questions answered. We also discussed his stroke risk and the risks and benefits of anticoagulation. Start Toprol 25 mg daily for rate control.  Suspect his untreated sleep apnea is contributing to his arrhythmia. Check echocardiogram once he is better rate controlled. Continue Eliquis 5 mg BID  2. Obesity Body mass index is 38.48 kg/m. Lifestyle modification was discussed at length including regular exercise and weight reduction.  3. Obstructive sleep apnea The importance of adequate treatment of sleep apnea was discussed today in order to improve our ability to maintain sinus rhythm long term. Patient to reach out to sleep medicine to schedule CPAP titration.   4. HTN Stable, med changes as above.    Follow up in the AF clinic 3-5 days.    11/07/19 PA-C Afib Clinic [facility] [address] [place], [place] [phone] 07/01/2020 3:22 PM

## 2020-07-02 NOTE — Telephone Encounter (Signed)
Patients insurance verified and faxed to [facility].

## 2020-07-03 NOTE — Progress Notes (Signed)
Primary Care Physician: Pcp, No Primary Cardiologist: none Primary Electrophysiologist: none Referring Physician: [facility]   Daryl [name] is a 65 y.o. male with a history of HTN, OSA, and atrial fibrillation who presents for follow up in the [facility].  The patient was initially diagnosed with atrial fibrillation 06/24/20 after presenting to the ED with symptoms of chest presure. ECG showed afib with RVR. He underwent DCCV at that time and his symptoms resolved. Patient was started on Eliquis for a CHADS2VASC score of 1. Patient does have a diagnosis of severe OSA but has not yet had CPAP titration.   On follow up today, patient reports he has done reasonably well since his last visit. He has occasional fluttering sensations but otherwise is unaware of his arrhythmia. He denies any bleeding issues on anticoagulation. He has reached out to sleep medicine to get started on CPAP.  Today, he denies symptoms of shortness of breath, orthopnea, PND, lower extremity edema, dizziness, presyncope, syncope, bleeding, or neurologic sequela. The patient is tolerating medications without difficulties and is otherwise without complaint today.    Atrial Fibrillation Risk Factors:  he does have symptoms or diagnosis of sleep apnea. he is not compliant with CPAP therapy. Needs titration he does not have a history of rheumatic fever. he does not have a history of alcohol use. The patient does not have a history of early familial atrial fibrillation or other arrhythmias.  he has a BMI of Body mass index is 38.22 kg/m.Marland Kitchen Filed Weights   07/04/20 0838  Weight: 120.8 kg    No family history on file.   Atrial Fibrillation Management history:  Previous antiarrhythmic drugs: none Previous cardioversions: 06/24/20 Previous ablations: none CHADS2VASC score: 1 Anticoagulation history: Eliquis   Past Medical History:  Diagnosis Date  . HTN (hypertension)   . OSA  (obstructive sleep apnea)    No past surgical history on file.  Current Outpatient Medications  Medication Sig Dispense Refill  . acetaminophen (TYLENOL) 500 MG tablet Take 500 mg by mouth every 6 (six) hours as needed for mild pain or moderate pain.    Marland Kitchen apixaban (ELIQUIS) 5 MG TABS tablet Take 1 tablet (5 mg total) by mouth 2 (two) times daily. 60 tablet 0  . lisinopril-hydrochlorothiazide (ZESTORETIC) 20-12.5 MG tablet Take 1 tablet by mouth daily.    . metoprolol succinate (TOPROL XL) 25 MG 24 hr tablet Take 1 tablet (25 mg total) by mouth daily. 30 tablet 3  . NEOMYCIN-POLYMYXIN-HYDROCORTISONE (CORTISPORIN) 1 % SOLN OTIC solution Place 1 drop into both ears daily as needed (itching).     No current facility-administered medications for this encounter.    No Known Allergies  Social History   Socioeconomic History  . Marital status: Married    Spouse name: Not on file  . Number of children: Not on file  . Years of education: Not on file  . Highest education level: Not on file  Occupational History  . Not on file  Tobacco Use  . Smoking status: Never Smoker  . Smokeless tobacco: Never Used  Vaping Use  . Vaping Use: Never used  Substance and Sexual Activity  . Alcohol use: Not Currently  . Drug use: Never  . Sexual activity: Not on file  Other Topics Concern  . Not on file  Social History Narrative  . Not on file   Social Determinants of Health   Financial Resource Strain: Not on file  Food Insecurity: Not on  file  Transportation Needs: Not on file  Physical Activity: Not on file  Stress: Not on file  Social Connections: Not on file  Intimate Partner Violence: Not on file     ROS- All systems are reviewed and negative except as per the HPI above.  Physical Exam: Vitals:   07/04/20 0838  BP: 112/82  Pulse: (!) 133  Weight: 120.8 kg  Height: 5\' 10"  (1.778 m)    GEN- The patient is a well appearing obese male, alert and oriented x 3 today.   HEENT-head  normocephalic, atraumatic, sclera clear, conjunctiva pink, hearing intact, trachea midline. Lungs- Clear to ausculation bilaterally, normal work of breathing Heart- irregular rate and rhythm, tachycardia, no murmurs, rubs or gallops  GI- soft, NT, ND, + BS Extremities- no clubbing, cyanosis, or edema MS- no significant deformity or atrophy Skin- no rash or lesion Psych- euthymic mood, full affect Neuro- strength and sensation are intact   Wt Readings from Last 3 Encounters:  07/04/20 120.8 kg  07/01/20 121.7 kg  06/24/20 113.4 kg    EKG today demonstrates  Afib, inc RBBB Vent. rate 133 BPM PR interval * ms QRS duration 96 ms QT/QTc 268/398 ms  Epic records are reviewed at length today  CHA2DS2-VASc Score = 1  The patient's score is based upon: CHF History: No HTN History: Yes Diabetes History: No Stroke History: No Vascular Disease History: No Age Score: 0 Gender Score: 0      ASSESSMENT AND PLAN: 1. Persistent atrial fibrillation  The patient's CHA2DS2-VASc score is 1, indicating a 0.6% annual risk of stroke.   Increase Toprol to 25 mg BID. Patient to call clinic on Monday with heart rates.  Suspect his untreated sleep apnea is contributing to his arrhythmia. Check echocardiogram He will likely need AAD in order to maintain SR. If no structural abnormalities on echo, would consider flecainide. Continue Eliquis 5 mg BID  2. Obesity Body mass index is 38.22 kg/m. Lifestyle modification was discussed and encouraged including regular physical activity and weight reduction.  3. Obstructive sleep apnea Patient to hopefully get on CPAP soon.   4. HTN Stable, med changes as above. May need to decrease/hold lisinopril-HCTZ to accommodate rate control. Patient has BP machine at home to monitor.    Follow up in the AF clinic in one week.    Thursday PA-C Afib Clinic [facility] [address] [place], [place]  [place] [phone] 07/04/2020 8:47 AM

## 2020-07-04 ENCOUNTER — Other Ambulatory Visit: Payer: Self-pay

## 2020-07-04 ENCOUNTER — Encounter (HOSPITAL_COMMUNITY): Payer: Self-pay | Admitting: Physician Assistant

## 2020-07-04 ENCOUNTER — Ambulatory Visit (HOSPITAL_COMMUNITY)
Admission: RE | Admit: 2020-07-04 | Discharge: 2020-07-04 | Disposition: A | Discharge status: Home / Self Care | Payer: BC Managed Care – PPO | Source: Ambulatory Visit | Attending: Physician Assistant | Admitting: Physician Assistant

## 2020-07-04 VITALS — BP 112/82 | HR 133 | Ht 70.0 in | Wt 266.4 lb

## 2020-07-04 DIAGNOSIS — E669 Obesity, unspecified: Secondary | ICD-10-CM | POA: Diagnosis not present

## 2020-07-04 DIAGNOSIS — Z79899 Other long term (current) drug therapy: Secondary | ICD-10-CM | POA: Diagnosis not present

## 2020-07-04 DIAGNOSIS — I10 Essential (primary) hypertension: Secondary | ICD-10-CM | POA: Diagnosis not present

## 2020-07-04 DIAGNOSIS — G4733 Obstructive sleep apnea (adult) (pediatric): Secondary | ICD-10-CM | POA: Diagnosis not present

## 2020-07-04 DIAGNOSIS — I4819 Other persistent atrial fibrillation: Secondary | ICD-10-CM | POA: Insufficient documentation

## 2020-07-04 DIAGNOSIS — Z6838 Body mass index (BMI) 38.0-38.9, adult: Secondary | ICD-10-CM | POA: Insufficient documentation

## 2020-07-04 DIAGNOSIS — Z7901 Long term (current) use of anticoagulants: Secondary | ICD-10-CM | POA: Insufficient documentation

## 2020-07-04 DIAGNOSIS — Z7182 Exercise counseling: Secondary | ICD-10-CM | POA: Diagnosis not present

## 2020-07-04 MED ORDER — METOPROLOL SUCCINATE ER 25 MG PO TB24: 25.0000 mg | ORAL_TABLET | Freq: Two times a day (BID) | ORAL | 3 refills | Status: DC

## 2020-07-04 NOTE — Patient Instructions (Addendum)
Increase metoprolol to 25mg  twice a day   Call Monday with update of heart rate (pulse) and blood pressure

## 2020-07-08 ENCOUNTER — Telehealth (HOSPITAL_COMMUNITY): Payer: Self-pay | Admitting: Physician Assistant

## 2020-07-08 NOTE — Telephone Encounter (Signed)
Patient called today to give updated heart rates and BP readings. His heart rates have been much better controlled 50s-102. BP 110s-130s/70s-80s. Will continue present medication until follow up. Echo scheduled for 07/10/20.

## 2020-07-10 ENCOUNTER — Ambulatory Visit (HOSPITAL_COMMUNITY)
Admission: RE | Admit: 2020-07-10 | Discharge: 2020-07-10 | Disposition: A | Discharge status: Home / Self Care | Payer: BC Managed Care – PPO | Source: Ambulatory Visit | Attending: Physician Assistant | Admitting: Physician Assistant

## 2020-07-10 ENCOUNTER — Other Ambulatory Visit: Payer: Self-pay

## 2020-07-10 DIAGNOSIS — I081 Rheumatic disorders of both mitral and tricuspid valves: Secondary | ICD-10-CM | POA: Insufficient documentation

## 2020-07-10 DIAGNOSIS — I480 Paroxysmal atrial fibrillation: Secondary | ICD-10-CM | POA: Insufficient documentation

## 2020-07-10 DIAGNOSIS — I4891 Unspecified atrial fibrillation: Secondary | ICD-10-CM | POA: Diagnosis not present

## 2020-07-10 LAB — ECHOCARDIOGRAM COMPLETE
Calc EF: 40.5 %
S' Lateral: 3.7 cm
Single Plane A2C EF: 38.8 %
Single Plane A4C EF: 43.2 %

## 2020-07-10 MED ORDER — PERFLUTREN LIPID MICROSPHERE
1.0000 mL | INTRAVENOUS | Status: AC | PRN
  Administered 2020-07-10: 2 mL via INTRAVENOUS
  Filled 2020-07-10: qty 10

## 2020-07-10 NOTE — H&P (View-Only) (Signed)
  Primary Care Physician: Pcp, No Primary Cardiologist: none Primary Electrophysiologist: none Referring Physician: [facility]   Daryl [name] is a 65 y.o. male with a history of HTN, OSA, and atrial fibrillation who presents for follow up in the [facility].  The patient was initially diagnosed with atrial fibrillation 06/24/20 after presenting to the ED with symptoms of chest presure. ECG showed afib with RVR. He underwent DCCV at that time and his symptoms resolved. Patient was started on Eliquis for a CHADS2VASC score of 1. Patient does have a diagnosis of severe OSA but has not yet had CPAP titration.   On follow up today, patient reports he has noticed an improvement in his heart rate since increasing the BB. His rates are now usually in the 70s-80s. His echo showed EF 50%, mild MR. He denies any bleeding issues on anticoagulation.   Today, he denies symptoms of palpitations, chest pain, shortness of breath, orthopnea, PND, lower extremity edema, dizziness, presyncope, syncope, bleeding, or neurologic sequela. The patient is tolerating medications without difficulties and is otherwise without complaint today.    Atrial Fibrillation Risk Factors:  he does have symptoms or diagnosis of sleep apnea. he is not compliant with CPAP therapy. Needs titration he does not have a history of rheumatic fever. he does not have a history of alcohol use. The patient does not have a history of early familial atrial fibrillation or other arrhythmias.  he has a BMI of Body mass index is 37.99 kg/m.. Filed Weights   07/11/20 0939  Weight: 120.1 kg    No family history on file.   Atrial Fibrillation Management history:  Previous antiarrhythmic drugs: none Previous cardioversions: 06/24/20 Previous ablations: none CHADS2VASC score: 1 Anticoagulation history: Eliquis   Past Medical History:  Diagnosis Date  . HTN (hypertension)   . OSA (obstructive sleep  apnea)    No past surgical history on file.  Current Outpatient Medications  Medication Sig Dispense Refill  . acetaminophen (TYLENOL) 500 MG tablet Take 500 mg by mouth every 6 (six) hours as needed for mild pain or moderate pain.    . apixaban (ELIQUIS) 5 MG TABS tablet Take 1 tablet (5 mg total) by mouth 2 (two) times daily. 60 tablet 0  . lisinopril-hydrochlorothiazide (ZESTORETIC) 20-12.5 MG tablet Take 1 tablet by mouth daily.    . metoprolol succinate (TOPROL XL) 25 MG 24 hr tablet Take 1 tablet (25 mg total) by mouth in the morning and at bedtime. 30 tablet 3  . NEOMYCIN-POLYMYXIN-HYDROCORTISONE (CORTISPORIN) 1 % SOLN OTIC solution Place 1 drop into both ears daily as needed (itching).     No current facility-administered medications for this encounter.    No Known Allergies  Social History   Socioeconomic History  . Marital status: Married    Spouse name: Not on file  . Number of children: Not on file  . Years of education: Not on file  . Highest education level: Not on file  Occupational History  . Not on file  Tobacco Use  . Smoking status: Never Smoker  . Smokeless tobacco: Never Used  Vaping Use  . Vaping Use: Never used  Substance and Sexual Activity  . Alcohol use: Not Currently  . Drug use: Never  . Sexual activity: Not on file  Other Topics Concern  . Not on file  Social History Narrative  . Not on file   Social Determinants of Health   Financial Resource Strain: Not on file    Food Insecurity: Not on file  Transportation Needs: Not on file  Physical Activity: Not on file  Stress: Not on file  Social Connections: Not on file  Intimate Partner Violence: Not on file     ROS- All systems are reviewed and negative except as per the HPI above.  Physical Exam: Vitals:   07/11/20 0939  BP: 116/76  Pulse: (!) 105  Weight: 120.1 kg  Height: 5\' 10"  (1.778 m)    GEN- The patient is a well appearing obese male, alert and oriented x 3 today.    HEENT-head normocephalic, atraumatic, sclera clear, conjunctiva pink, hearing intact, trachea midline. Lungs- Clear to ausculation bilaterally, normal work of breathing Heart- irregular rate and rhythm, no murmurs, rubs or gallops  GI- soft, NT, ND, + BS Extremities- no clubbing, cyanosis, or edema MS- no significant deformity or atrophy Skin- no rash or lesion Psych- euthymic mood, full affect Neuro- strength and sensation are intact   Wt Readings from Last 3 Encounters:  07/11/20 120.1 kg  07/04/20 120.8 kg  07/01/20 121.7 kg    EKG today demonstrates  Afib Vent. rate 105 BPM PR interval * ms QRS duration 106 ms QT/QTcB 324/428 ms  Echo 07/10/20 demonstrated 1. Left ventricular ejection fraction, by estimation, is 50%. The left  ventricle has low normal function. The left ventricle has no regional wall  motion abnormalities. Left ventricular diastolic parameters are  indeterminate.  2. Right ventricular systolic function is mildly reduced. The right  ventricular size is normal.  3. The mitral valve is normal in structure. Mild mitral valve  regurgitation.  4. The aortic valve is tricuspid. Aortic valve regurgitation is trivial.  Mild aortic valve sclerosis is present, with no evidence of aortic valve  stenosis.  5. The inferior vena cava is normal in size with greater than 50%  respiratory variability, suggesting right atrial pressure of 3 mmHg.   Epic records are reviewed at length today  CHA2DS2-VASc Score = 1  The patient's score is based upon: CHF History: No HTN History: Yes Diabetes History: No Stroke History: No Vascular Disease History: No Age Score: 0 Gender Score: 0      ASSESSMENT AND PLAN: 1. Persistent atrial fibrillation  The patient's CHA2DS2-VASc score is 1, indicating a 0.6% annual risk of stroke.   Patient remains in afib although heart rates have improved. Continue Toprol 25 mg BID Suspect his untreated sleep apnea is contributing  to his arrhythmia. Will start flecainide 100 mg BID and plan for DCCV. Check cbc/bmet Continue Eliquis 5 mg BID, patient denies any missed doses. Will consider stress test with flecainide once he is back in SR.  2. Obesity Body mass index is 37.99 kg/m. Lifestyle modification was discussed and encouraged including regular physical activity and weight reduction.  3. Obstructive sleep apnea Patient working on getting CPAP.  4. HTN Stable, no changes today.    Follow up in the AF clinic 5-7 days post DCCV.    07/12/20 PA-C Afib Clinic [facility] [address] [place], [place] [phone] 07/11/2020 9:49 AM

## 2020-07-10 NOTE — Progress Notes (Signed)
  Echocardiogram 2D Echocardiogram with contrast has been performed.  Roosvelt Maser F 07/10/2020, 2:59 PM

## 2020-07-10 NOTE — Progress Notes (Signed)
Primary Care Physician: Pcp, No Primary Cardiologist: none Primary Electrophysiologist: none Referring Physician: [facility]   Daryl [name] is a 64 y.o. male with a history of HTN, OSA, and atrial fibrillation who presents for follow up in the [facility].  The patient was initially diagnosed with atrial fibrillation 06/24/20 after presenting to the ED with symptoms of chest presure. ECG showed afib with RVR. He underwent DCCV at that time and his symptoms resolved. Patient was started on Eliquis for a CHADS2VASC score of 1. Patient does have a diagnosis of severe OSA but has not yet had CPAP titration.   On follow up today, patient reports he has noticed an improvement in his heart rate since increasing the BB. His rates are now usually in the 70s-80s. His echo showed EF 50%, mild MR. He denies any bleeding issues on anticoagulation.   Today, he denies symptoms of palpitations, chest pain, shortness of breath, orthopnea, PND, lower extremity edema, dizziness, presyncope, syncope, bleeding, or neurologic sequela. The patient is tolerating medications without difficulties and is otherwise without complaint today.    Atrial Fibrillation Risk Factors:  he does have symptoms or diagnosis of sleep apnea. he is not compliant with CPAP therapy. Needs titration he does not have a history of rheumatic fever. he does not have a history of alcohol use. The patient does not have a history of early familial atrial fibrillation or other arrhythmias.  he has a BMI of Body mass index is 37.99 kg/m.Marland Kitchen Filed Weights   07/11/20 0939  Weight: 120.1 kg    No family history on file.   Atrial Fibrillation Management history:  Previous antiarrhythmic drugs: none Previous cardioversions: 06/24/20 Previous ablations: none CHADS2VASC score: 1 Anticoagulation history: Eliquis   Past Medical History:  Diagnosis Date  . HTN (hypertension)   . OSA (obstructive sleep  apnea)    No past surgical history on file.  Current Outpatient Medications  Medication Sig Dispense Refill  . acetaminophen (TYLENOL) 500 MG tablet Take 500 mg by mouth every 6 (six) hours as needed for mild pain or moderate pain.    Marland Kitchen apixaban (ELIQUIS) 5 MG TABS tablet Take 1 tablet (5 mg total) by mouth 2 (two) times daily. 60 tablet 0  . lisinopril-hydrochlorothiazide (ZESTORETIC) 20-12.5 MG tablet Take 1 tablet by mouth daily.    . metoprolol succinate (TOPROL XL) 25 MG 24 hr tablet Take 1 tablet (25 mg total) by mouth in the morning and at bedtime. 30 tablet 3  . NEOMYCIN-POLYMYXIN-HYDROCORTISONE (CORTISPORIN) 1 % SOLN OTIC solution Place 1 drop into both ears daily as needed (itching).     No current facility-administered medications for this encounter.    No Known Allergies  Social History   Socioeconomic History  . Marital status: Married    Spouse name: Not on file  . Number of children: Not on file  . Years of education: Not on file  . Highest education level: Not on file  Occupational History  . Not on file  Tobacco Use  . Smoking status: Never Smoker  . Smokeless tobacco: Never Used  Vaping Use  . Vaping Use: Never used  Substance and Sexual Activity  . Alcohol use: Not Currently  . Drug use: Never  . Sexual activity: Not on file  Other Topics Concern  . Not on file  Social History Narrative  . Not on file   Social Determinants of Health   Financial Resource Strain: Not on file  Food Insecurity: Not on file  Transportation Needs: Not on file  Physical Activity: Not on file  Stress: Not on file  Social Connections: Not on file  Intimate Partner Violence: Not on file     ROS- All systems are reviewed and negative except as per the HPI above.  Physical Exam: Vitals:   07/11/20 0939  BP: 116/76  Pulse: (!) 105  Weight: 120.1 kg  Height: 5\' 10"  (1.778 m)    GEN- The patient is a well appearing obese male, alert and oriented x 3 today.    HEENT-head normocephalic, atraumatic, sclera clear, conjunctiva pink, hearing intact, trachea midline. Lungs- Clear to ausculation bilaterally, normal work of breathing Heart- irregular rate and rhythm, no murmurs, rubs or gallops  GI- soft, NT, ND, + BS Extremities- no clubbing, cyanosis, or edema MS- no significant deformity or atrophy Skin- no rash or lesion Psych- euthymic mood, full affect Neuro- strength and sensation are intact   Wt Readings from Last 3 Encounters:  07/11/20 120.1 kg  07/04/20 120.8 kg  07/01/20 121.7 kg    EKG today demonstrates  Afib Vent. rate 105 BPM PR interval * ms QRS duration 106 ms QT/QTcB 324/428 ms  Echo 07/10/20 demonstrated 1. Left ventricular ejection fraction, by estimation, is 50%. The left  ventricle has low normal function. The left ventricle has no regional wall  motion abnormalities. Left ventricular diastolic parameters are  indeterminate.  2. Right ventricular systolic function is mildly reduced. The right  ventricular size is normal.  3. The mitral valve is normal in structure. Mild mitral valve  regurgitation.  4. The aortic valve is tricuspid. Aortic valve regurgitation is trivial.  Mild aortic valve sclerosis is present, with no evidence of aortic valve  stenosis.  5. The inferior vena cava is normal in size with greater than 50%  respiratory variability, suggesting right atrial pressure of 3 mmHg.   Epic records are reviewed at length today  CHA2DS2-VASc Score = 1  The patient's score is based upon: CHF History: No HTN History: Yes Diabetes History: No Stroke History: No Vascular Disease History: No Age Score: 0 Gender Score: 0      ASSESSMENT AND PLAN: 1. Persistent atrial fibrillation  The patient's CHA2DS2-VASc score is 1, indicating a 0.6% annual risk of stroke.   Patient remains in afib although heart rates have improved. Continue Toprol 25 mg BID Suspect his untreated sleep apnea is contributing  to his arrhythmia. Will start flecainide 100 mg BID and plan for DCCV. Check cbc/bmet Continue Eliquis 5 mg BID, patient denies any missed doses. Will consider stress test with flecainide once he is back in SR.  2. Obesity Body mass index is 37.99 kg/m. Lifestyle modification was discussed and encouraged including regular physical activity and weight reduction.  3. Obstructive sleep apnea Patient working on getting CPAP.  4. HTN Stable, no changes today.    Follow up in the AF clinic 5-7 days post DCCV.    07/12/20 PA-C Afib Clinic [facility] [address] [place], [place] [phone] 07/11/2020 9:49 AM

## 2020-07-11 ENCOUNTER — Encounter (HOSPITAL_COMMUNITY): Payer: Self-pay | Admitting: Physician Assistant

## 2020-07-11 ENCOUNTER — Ambulatory Visit (HOSPITAL_COMMUNITY)
Admission: RE | Admit: 2020-07-11 | Discharge: 2020-07-11 | Disposition: A | Discharge status: Home / Self Care | Payer: BC Managed Care – PPO | Source: Ambulatory Visit | Attending: Physician Assistant | Admitting: Physician Assistant

## 2020-07-11 VITALS — BP 116/76 | HR 105 | Ht 70.0 in | Wt 264.8 lb

## 2020-07-11 DIAGNOSIS — Z20822 Contact with and (suspected) exposure to covid-19: Secondary | ICD-10-CM | POA: Diagnosis not present

## 2020-07-11 DIAGNOSIS — G4733 Obstructive sleep apnea (adult) (pediatric): Secondary | ICD-10-CM | POA: Diagnosis not present

## 2020-07-11 DIAGNOSIS — I4819 Other persistent atrial fibrillation: Secondary | ICD-10-CM | POA: Diagnosis present

## 2020-07-11 DIAGNOSIS — E669 Obesity, unspecified: Secondary | ICD-10-CM | POA: Insufficient documentation

## 2020-07-11 DIAGNOSIS — Z6837 Body mass index (BMI) 37.0-37.9, adult: Secondary | ICD-10-CM | POA: Diagnosis not present

## 2020-07-11 DIAGNOSIS — Z79899 Other long term (current) drug therapy: Secondary | ICD-10-CM | POA: Diagnosis not present

## 2020-07-11 DIAGNOSIS — I10 Essential (primary) hypertension: Secondary | ICD-10-CM | POA: Diagnosis not present

## 2020-07-11 DIAGNOSIS — Z7901 Long term (current) use of anticoagulants: Secondary | ICD-10-CM | POA: Diagnosis not present

## 2020-07-11 LAB — BASIC METABOLIC PANEL
Anion gap: 6 (ref 5–15)
BUN: 24 mg/dL — ABNORMAL HIGH (ref 8–23)
CO2: 26 mmol/L (ref 22–32)
Calcium: 9.4 mg/dL (ref 8.9–10.3)
Chloride: 104 mmol/L (ref 98–111)
Creatinine, Ser: 1.32 mg/dL — ABNORMAL HIGH (ref 0.61–1.24)
GFR, Estimated: >60 mL/min (ref >60)
Glucose, Bld: 97 mg/dL (ref 70–99)
Potassium: 4.5 mmol/L (ref 3.5–5.1)
Sodium: 136 mmol/L (ref 135–145)

## 2020-07-11 LAB — CBC
HCT: 50.8 % (ref 39.0–52.0)
Hemoglobin: 16.8 g/dL (ref 13.0–17.0)
MCH: 29.3 pg (ref 26.0–34.0)
MCHC: 33.1 g/dL (ref 30.0–36.0)
MCV: 88.7 fL (ref 80.0–100.0)
Platelets: 262 10*3/uL (ref 150–400)
RBC: 5.73 MIL/uL (ref 4.22–5.81)
RDW: 12.3 % (ref 11.5–15.5)
WBC: 7.2 10*3/uL (ref 4.0–10.5)
nRBC: 0 % (ref 0.0–0.2)

## 2020-07-11 MED ORDER — FLECAINIDE ACETATE 100 MG PO TABS: 100.0000 mg | ORAL_TABLET | Freq: Two times a day (BID) | ORAL | 3 refills | Status: DC

## 2020-07-11 NOTE — Patient Instructions (Addendum)
Start Flecainide 100mg  twice a day  Cardioversion scheduled for Monday, April 4th  - Arrive at the [facility][birth year][facility]1979 and go to admitting at [facility]  - Do not eat or drink anything after midnight the night prior to your procedure.  - Take all your morning medication (except diabetic medications) with a sip of water prior to arrival.  - You will not be able to drive home after your procedure.  - Do NOT miss any doses of your blood thinner - if you should miss a dose please notify our office immediately.  - If you feel as if you go back into normal rhythm prior to scheduled cardioversion, please notify our office immediately. If your procedure is canceled in the cardioversion suite you will be charged a cancellation fee.

## 2020-07-12 ENCOUNTER — Other Ambulatory Visit (HOSPITAL_COMMUNITY)
Admission: RE | Admit: 2020-07-12 | Discharge: 2020-07-12 | Disposition: A | Discharge status: Home / Self Care | Payer: BC Managed Care – PPO | Source: Ambulatory Visit | Attending: Internal Medicine | Admitting: Internal Medicine

## 2020-07-12 DIAGNOSIS — E669 Obesity, unspecified: Secondary | ICD-10-CM | POA: Diagnosis not present

## 2020-07-12 DIAGNOSIS — Z7901 Long term (current) use of anticoagulants: Secondary | ICD-10-CM | POA: Diagnosis not present

## 2020-07-12 DIAGNOSIS — Z6837 Body mass index (BMI) 37.0-37.9, adult: Secondary | ICD-10-CM | POA: Diagnosis not present

## 2020-07-12 DIAGNOSIS — Z79899 Other long term (current) drug therapy: Secondary | ICD-10-CM | POA: Diagnosis not present

## 2020-07-12 DIAGNOSIS — I10 Essential (primary) hypertension: Secondary | ICD-10-CM | POA: Diagnosis not present

## 2020-07-12 DIAGNOSIS — Z01812 Encounter for preprocedural laboratory examination: Secondary | ICD-10-CM | POA: Insufficient documentation

## 2020-07-12 DIAGNOSIS — I4819 Other persistent atrial fibrillation: Secondary | ICD-10-CM | POA: Diagnosis not present

## 2020-07-12 DIAGNOSIS — G4733 Obstructive sleep apnea (adult) (pediatric): Secondary | ICD-10-CM | POA: Diagnosis not present

## 2020-07-12 DIAGNOSIS — Z20822 Contact with and (suspected) exposure to covid-19: Secondary | ICD-10-CM | POA: Diagnosis not present

## 2020-07-12 LAB — SARS CORONAVIRUS 2 (TAT 6-24 HRS): SARS Coronavirus 2: NEGATIVE

## 2020-07-14 ENCOUNTER — Ambulatory Visit (HOSPITAL_COMMUNITY)
Admission: RE | Admit: 2020-07-14 | Discharge: 2020-07-14 | Disposition: A | Discharge status: Home / Self Care | Payer: BC Managed Care – PPO | Attending: Internal Medicine | Admitting: Internal Medicine

## 2020-07-14 ENCOUNTER — Ambulatory Visit (HOSPITAL_COMMUNITY): Payer: BC Managed Care – PPO | Admitting: Anesthesiology

## 2020-07-14 ENCOUNTER — Encounter (HOSPITAL_COMMUNITY): Payer: Self-pay | Admitting: Internal Medicine

## 2020-07-14 ENCOUNTER — Encounter (HOSPITAL_COMMUNITY)
Admission: RE | Disposition: A | Discharge status: Home / Self Care | Payer: Self-pay | Source: Home / Self Care | Attending: Internal Medicine

## 2020-07-14 DIAGNOSIS — Z79899 Other long term (current) drug therapy: Secondary | ICD-10-CM | POA: Insufficient documentation

## 2020-07-14 DIAGNOSIS — E669 Obesity, unspecified: Secondary | ICD-10-CM | POA: Diagnosis not present

## 2020-07-14 DIAGNOSIS — I4819 Other persistent atrial fibrillation: Secondary | ICD-10-CM | POA: Insufficient documentation

## 2020-07-14 DIAGNOSIS — I480 Paroxysmal atrial fibrillation: Secondary | ICD-10-CM

## 2020-07-14 DIAGNOSIS — G4733 Obstructive sleep apnea (adult) (pediatric): Secondary | ICD-10-CM | POA: Insufficient documentation

## 2020-07-14 DIAGNOSIS — Z7901 Long term (current) use of anticoagulants: Secondary | ICD-10-CM | POA: Insufficient documentation

## 2020-07-14 DIAGNOSIS — Z6837 Body mass index (BMI) 37.0-37.9, adult: Secondary | ICD-10-CM | POA: Insufficient documentation

## 2020-07-14 DIAGNOSIS — I10 Essential (primary) hypertension: Secondary | ICD-10-CM | POA: Insufficient documentation

## 2020-07-14 DIAGNOSIS — Z20822 Contact with and (suspected) exposure to covid-19: Secondary | ICD-10-CM | POA: Insufficient documentation

## 2020-07-14 HISTORY — PX: CARDIOVERSION: SHX1299

## 2020-07-14 SURGERY — CARDIOVERSION
Anesthesia: General

## 2020-07-14 MED ORDER — SODIUM CHLORIDE 0.9 % IV SOLN: INTRAVENOUS | Status: DC | PRN

## 2020-07-14 MED ORDER — PROPOFOL 10 MG/ML IV BOLUS
INTRAVENOUS | Status: DC | PRN
  Administered 2020-07-14: 70 mg via INTRAVENOUS

## 2020-07-14 MED ORDER — LIDOCAINE HCL (CARDIAC) PF 100 MG/5ML IV SOSY
PREFILLED_SYRINGE | INTRAVENOUS | Status: DC | PRN
  Administered 2020-07-14: 100 mg via INTRAVENOUS

## 2020-07-14 NOTE — CV Procedure (Signed)
   Electrical Cardioversion Procedure Note Daryl [name] [id number] Mar 09, 1956  Procedure: Electrical Cardioversion Indications:  Atrial Fibrillation  Time Out: Verified patient identification, verified procedure,medications/allergies/relevent history reviewed, required imaging and test results available.  Performed  Procedure Details  The patient was NPO after midnight. Anesthesia was administered at the beside  by Dr.Howzse with 60mg  of lidocaine and 60 mg propofol.  Cardioversion was done with synchronized biphasic defibrillation with AP pads with 200 Joules.  The patient converted to normal sinus rhythm. The patient tolerated the procedure well   IMPRESSION:  Successful cardioversion of atrial fibrillation    Daryl [name] 07/14/2020, 12:29 PM

## 2020-07-14 NOTE — Anesthesia Postprocedure Evaluation (Signed)
Anesthesia Post Note  Patient: Daryl [name]  Procedure(s) Performed: CARDIOVERSION (N/A )     Patient location during evaluation: PACU Anesthesia Type: General Level of consciousness: awake and alert and oriented Pain management: pain level controlled Vital Signs Assessment: post-procedure vital signs reviewed and stable Respiratory status: spontaneous breathing, nonlabored ventilation and respiratory function stable Cardiovascular status: blood pressure returned to baseline Postop Assessment: no apparent nausea or vomiting Anesthetic complications: no   No complications documented.  Last Vitals:  Vitals:   07/14/20 1225 07/14/20 1240  BP: 111/69 (!) 95/59  Pulse: 64 63  Resp: (!) 21 19  Temp: 36.5 C   SpO2: 94% 96%    Last Pain:  Vitals:   07/14/20 1240  TempSrc:   PainSc: 0-No pain                 Kaylyn Layer

## 2020-07-14 NOTE — Discharge Instructions (Signed)

## 2020-07-14 NOTE — Transfer of Care (Signed)
Immediate Anesthesia Transfer of Care Note  Patient: Daryl [name]  Procedure(s) Performed: CARDIOVERSION (N/A )  Patient Location: Endoscopy Unit  Anesthesia Type:General  Level of Consciousness: awake, alert  and oriented  Airway & Oxygen Therapy: Patient Spontanous Breathing  Post-op Assessment: Report given to RN, Post -op Vital signs reviewed and stable and Patient moving all extremities X 4  Post vital signs: Reviewed and stable  Last Vitals:  Vitals Value Taken Time  BP    Temp    Pulse    Resp    SpO2      Last Pain:  Vitals:   07/14/20 1100  TempSrc: Temporal  PainSc: 0-No pain         Complications: No complications documented.

## 2020-07-14 NOTE — Interval H&P Note (Signed)
History and Physical Interval Note:  07/14/2020 10:58 AM  Daryl [name]  has presented today for surgery, with the diagnosis of AFIB.  The various methods of treatment have been discussed with the patient and family. After consideration of risks, benefits and other options for treatment, the patient has consented to  Procedure(s): CARDIOVERSION (N/A) as a surgical intervention.  The patient's history has been reviewed, patient examined, no change in status, stable for surgery.  I have reviewed the patient's chart and labs.  Questions were answered to the patient's satisfaction.     Wille Aubuchon A Nickolas Chalfin

## 2020-07-14 NOTE — Anesthesia Preprocedure Evaluation (Signed)
Anesthesia Evaluation  Patient identified by MRN, date of birth, ID band Patient awake    Reviewed: Allergy & Precautions, NPO status , Patient's Chart, lab work & pertinent test results  History of Anesthesia Complications Negative for: history of anesthetic complications  Airway Mallampati: II  TM Distance: >3 FB Neck ROM: Full    Dental no notable dental hx.    Pulmonary sleep apnea ,    Pulmonary exam normal        Cardiovascular hypertension, Pt. on medications Normal cardiovascular exam+ dysrhythmias Atrial Fibrillation   TTE 06/2020: EF 50%, RV systolic function mildly reduced, mild MR    Neuro/Psych negative neurological ROS  negative psych ROS   GI/Hepatic negative GI ROS, Neg liver ROS,   Endo/Other  BMI 38  Renal/GU negative Renal ROS  negative genitourinary   Musculoskeletal  (+) Arthritis ,   Abdominal   Peds  Hematology negative hematology ROS (+)   Anesthesia Other Findings Day of surgery medications reviewed with patient.  Reproductive/Obstetrics negative OB ROS                             Anesthesia Physical Anesthesia Plan  ASA: III  Anesthesia Plan: General   Post-op Pain Management:    Induction: Intravenous  PONV Risk Score and Plan: Treatment may vary due to age or medical condition and Propofol infusion  Airway Management Planned: Mask  Additional Equipment: None  Intra-op Plan:   Post-operative Plan:   Informed Consent: I have reviewed the patients History and Physical, chart, labs and discussed the procedure including the risks, benefits and alternatives for the proposed anesthesia with the patient or authorized representative who has indicated his/her understanding and acceptance.       Plan Discussed with: CRNA  Anesthesia Plan Comments:         Anesthesia Quick Evaluation

## 2020-07-15 NOTE — Progress Notes (Signed)
Attempted, unable to leave message/bt 

## 2020-07-16 ENCOUNTER — Encounter (HOSPITAL_COMMUNITY): Payer: Self-pay | Admitting: Internal Medicine

## 2020-07-17 NOTE — Progress Notes (Signed)
Primary Care Physician: Pcp, No Primary Cardiologist: none Primary Electrophysiologist: none Referring Physician: [facility]   Daryl [name] is a 65 y.o. male with a history of HTN, OSA, and atrial fibrillation who presents for follow up in the [facility].  The patient was initially diagnosed with atrial fibrillation 06/24/20 after presenting to the ED with symptoms of chest presure. ECG showed afib with RVR. He underwent DCCV at that time and his symptoms resolved. Patient was started on Eliquis for a CHADS2VASC score of 1. Patient does have a diagnosis of severe OSA but has not yet had CPAP titration. His echo showed EF 50%, mild MR.   On follow up today, patient is s/p repeat DCCV on 07/14/20 after starting flecainide. He reports that he feels well since the procedure. He is tolerating the medication without difficulty. He denies any bleeding issues on anticoagulation.   Today, he denies symptoms of palpitations, chest pain, shortness of breath, orthopnea, PND, lower extremity edema, dizziness, presyncope, syncope, bleeding, or neurologic sequela. The patient is tolerating medications without difficulties and is otherwise without complaint today.    Atrial Fibrillation Risk Factors:  he does have symptoms or diagnosis of sleep apnea. he is not compliant with CPAP therapy. Needs titration he does not have a history of rheumatic fever. he does not have a history of alcohol use. The patient does not have a history of early familial atrial fibrillation or other arrhythmias.  he has a BMI of Body mass index is 38.25 kg/m.Marland Kitchen Filed Weights   07/18/20 1107  Weight: 120.9 kg    No family history on file.   Atrial Fibrillation Management history:  Previous antiarrhythmic drugs: flecainide  Previous cardioversions: 06/24/20, 07/14/20 Previous ablations: none CHADS2VASC score: 1 Anticoagulation history: Eliquis   Past Medical History:  Diagnosis Date   . HTN (hypertension)   . OSA (obstructive sleep apnea)    Past Surgical History:  Procedure Laterality Date  . CARDIOVERSION N/A 07/14/2020   Procedure: CARDIOVERSION;  Surgeon: Christell Constant, MD;  Location: [facility];  Service: Cardiovascular;  Laterality: N/A;    Current Outpatient Medications  Medication Sig Dispense Refill  . acetaminophen (TYLENOL) 500 MG tablet Take 500 mg by mouth every 6 (six) hours as needed for mild pain or moderate pain.    Marland Kitchen apixaban (ELIQUIS) 5 MG TABS tablet Take 1 tablet (5 mg total) by mouth 2 (two) times daily. 60 tablet 0  . flecainide (TAMBOCOR) 100 MG tablet Take 1 tablet (100 mg total) by mouth 2 (two) times daily. 60 tablet 3  . lisinopril-hydrochlorothiazide (ZESTORETIC) 20-12.5 MG tablet Take 1 tablet by mouth daily.    . metoprolol succinate (TOPROL XL) 25 MG 24 hr tablet Take 1 tablet (25 mg total) by mouth in the morning and at bedtime. 30 tablet 3  . NEOMYCIN-POLYMYXIN-HYDROCORTISONE (CORTISPORIN) 1 % SOLN OTIC solution Place 1 drop into both ears daily as needed (itching).     No current facility-administered medications for this encounter.    No Known Allergies  Social History   Socioeconomic History  . Marital status: Married    Spouse name: Not on file  . Number of children: Not on file  . Years of education: Not on file  . Highest education level: Not on file  Occupational History  . Not on file  Tobacco Use  . Smoking status: Never Smoker  . Smokeless tobacco: Never Used  Vaping Use  . Vaping Use: Never used  Substance and Sexual Activity  . Alcohol use: Not Currently  . Drug use: Never  . Sexual activity: Not on file  Other Topics Concern  . Not on file  Social History Narrative  . Not on file   Social Determinants of Health   Financial Resource Strain: Not on file  Food Insecurity: Not on file  Transportation Needs: Not on file  Physical Activity: Not on file  Stress: Not on file  Social  Connections: Not on file  Intimate Partner Violence: Not on file     ROS- All systems are reviewed and negative except as per the HPI above.  Physical Exam: Vitals:   07/18/20 1107  BP: 124/86  Pulse: 61  Weight: 120.9 kg  Height: 5\' 10"  (1.778 m)    GEN- The patient is a well appearing obese male, alert and oriented x 3 today.   HEENT-head normocephalic, atraumatic, sclera clear, conjunctiva pink, hearing intact, trachea midline. Lungs- Clear to ausculation bilaterally, normal work of breathing Heart- Regular rate and rhythm, no murmurs, rubs or gallops  GI- soft, NT, ND, + BS Extremities- no clubbing, cyanosis, or edema MS- no significant deformity or atrophy Skin- no rash or lesion Psych- euthymic mood, full affect Neuro- strength and sensation are intact   Wt Readings from Last 3 Encounters:  07/18/20 120.9 kg  07/14/20 120.1 kg  07/11/20 120.1 kg    EKG today demonstrates  SR Vent. rate 61 BPM PR interval 200 ms QRS duration 124 ms QT/QTcB 444/446 ms  Echo 07/10/20 demonstrated 1. Left ventricular ejection fraction, by estimation, is 50%. The left  ventricle has low normal function. The left ventricle has no regional wall  motion abnormalities. Left ventricular diastolic parameters are  indeterminate.  2. Right ventricular systolic function is mildly reduced. The right  ventricular size is normal.  3. The mitral valve is normal in structure. Mild mitral valve  regurgitation.  4. The aortic valve is tricuspid. Aortic valve regurgitation is trivial.  Mild aortic valve sclerosis is present, with no evidence of aortic valve  stenosis.  5. The inferior vena cava is normal in size with greater than 50%  respiratory variability, suggesting right atrial pressure of 3 mmHg.   Epic records are reviewed at length today  CHA2DS2-VASc Score = 1  The patient's score is based upon: CHF History: No HTN History: Yes Diabetes History: No Stroke History:  No Vascular Disease History: No Age Score: 0 Gender Score: 0      ASSESSMENT AND PLAN: 1. Persistent atrial fibrillation  The patient's CHA2DS2-VASc score is 1, indicating a 0.6% annual risk of stroke.   S/p DCCV on 07/14/20 Patient appears to be maintaining SR.  Continue Toprol 25 mg BID Continue flecainide 100 mg BID Continue Eliquis 5 mg BID Will arrange for ETT now he is on flecainide.   2. Obesity Body mass index is 38.25 kg/m. Lifestyle modification was discussed and encouraged including regular physical activity and weight reduction.  3. Obstructive sleep apnea Patient working on getting CPAP.  4. HTN Stable, his BP was low at DCCV. Will resume HCTZ 12.5 mg daily without lisinopril.    Follow up in the AF clinic in 3 months.    09/13/20 PA-C Afib Clinic [facility] [address] [place], [place] [phone] 07/18/2020 11:14 AM

## 2020-07-18 ENCOUNTER — Other Ambulatory Visit: Payer: Self-pay

## 2020-07-18 ENCOUNTER — Encounter (HOSPITAL_COMMUNITY): Payer: Self-pay | Admitting: Physician Assistant

## 2020-07-18 ENCOUNTER — Ambulatory Visit (HOSPITAL_COMMUNITY)
Admission: RE | Admit: 2020-07-18 | Discharge: 2020-07-18 | Disposition: A | Discharge status: Home / Self Care | Payer: BC Managed Care – PPO | Source: Ambulatory Visit | Attending: Physician Assistant | Admitting: Physician Assistant

## 2020-07-18 VITALS — BP 124/86 | HR 61 | Ht 70.0 in | Wt 266.6 lb

## 2020-07-18 DIAGNOSIS — I4819 Other persistent atrial fibrillation: Secondary | ICD-10-CM | POA: Diagnosis present

## 2020-07-18 DIAGNOSIS — Z7901 Long term (current) use of anticoagulants: Secondary | ICD-10-CM | POA: Diagnosis not present

## 2020-07-18 DIAGNOSIS — Z79899 Other long term (current) drug therapy: Secondary | ICD-10-CM | POA: Diagnosis not present

## 2020-07-18 DIAGNOSIS — I10 Essential (primary) hypertension: Secondary | ICD-10-CM | POA: Diagnosis not present

## 2020-07-18 DIAGNOSIS — E669 Obesity, unspecified: Secondary | ICD-10-CM | POA: Diagnosis not present

## 2020-07-18 DIAGNOSIS — G4733 Obstructive sleep apnea (adult) (pediatric): Secondary | ICD-10-CM | POA: Diagnosis not present

## 2020-07-18 DIAGNOSIS — Z6838 Body mass index (BMI) 38.0-38.9, adult: Secondary | ICD-10-CM | POA: Insufficient documentation

## 2020-07-18 MED ORDER — HYDROCHLOROTHIAZIDE 12.5 MG PO CAPS: 12.5000 mg | ORAL_CAPSULE | Freq: Every day | ORAL | 1 refills | Status: DC

## 2020-07-18 NOTE — Patient Instructions (Addendum)
Start HCTZ 12.5mg once a day

## 2020-07-22 ENCOUNTER — Other Ambulatory Visit (HOSPITAL_COMMUNITY): Payer: Self-pay | Admitting: *Deleted

## 2020-07-22 MED ORDER — APIXABAN 5 MG PO TABS: 5.0000 mg | ORAL_TABLET | Freq: Two times a day (BID) | ORAL | 3 refills | Status: DC

## 2020-07-30 ENCOUNTER — Other Ambulatory Visit (HOSPITAL_COMMUNITY): Payer: Self-pay | Admitting: Physician Assistant

## 2020-07-30 DIAGNOSIS — I4819 Other persistent atrial fibrillation: Secondary | ICD-10-CM

## 2020-08-04 ENCOUNTER — Ambulatory Visit: Payer: BC Managed Care – PPO | Admitting: Physician Assistant

## 2020-08-04 ENCOUNTER — Encounter: Payer: Self-pay | Admitting: Physician Assistant

## 2020-08-04 DIAGNOSIS — M1711 Unilateral primary osteoarthritis, right knee: Secondary | ICD-10-CM | POA: Diagnosis not present

## 2020-08-04 DIAGNOSIS — M1712 Unilateral primary osteoarthritis, left knee: Secondary | ICD-10-CM

## 2020-08-04 MED ORDER — LIDOCAINE HCL 1 % IJ SOLN
0.5000 mL | INTRAMUSCULAR | Status: AC | PRN
  Administered 2020-08-04: .5 mL

## 2020-08-04 MED ORDER — METHYLPREDNISOLONE ACETATE 40 MG/ML IJ SUSP
40.0000 mg | INTRAMUSCULAR | Status: AC | PRN
Start: 2020-08-04 — End: 2020-08-04
  Administered 2020-08-04: 40 mg via INTRA_ARTICULAR

## 2020-08-04 MED ORDER — METHYLPREDNISOLONE ACETATE 40 MG/ML IJ SUSP
40.0000 mg | INTRAMUSCULAR | Status: AC | PRN
  Administered 2020-08-04: 40 mg via INTRA_ARTICULAR

## 2020-08-04 MED ORDER — LIDOCAINE HCL 1 % IJ SOLN
0.5000 mL | INTRAMUSCULAR | Status: AC | PRN
Start: 2020-08-04 — End: 2020-08-04
  Administered 2020-08-04: .5 mL

## 2020-08-04 NOTE — Progress Notes (Signed)
   Procedure Note  Patient: Daryl [name]             Date of Birth: July 02, 1955           MRN: [id number]             Visit Date: 08/04/2020 HPI: Daryl [name] comes in today wanting cortisone injections in both knees.  He was last seen January 2021 and was given gel injections which he states lasted for several months.  He is going on a trip to [place] and wants injections both knees prior to this.  He has had no injury to either knee.  Denies any fevers chills recent vaccines.  He speaks some English but is accompanied by an interpreter today.  Physical exam: Bilateral knees good range of motion.  No abnormal warmth erythema or effusion.  No instability valgus varus stressing.  Varus malalignment both knees.  Patellofemoral crepitus with passive range of motion both knees.  Tenderness along the medial joint line of both knees. Procedures: Visit Diagnoses:  1. Unilateral primary osteoarthritis, left knee   2. Unilateral primary osteoarthritis, right knee     Large Joint Inj: bilateral knee on 08/04/2020 9:33 AM Indications: pain Details: 22 G 1.5 in needle, anterolateral approach  Arthrogram: No  Medications (Right): 0.5 mL lidocaine 1 %; 40 mg methylPREDNISolone acetate 40 MG/ML Medications (Left): 0.5 mL lidocaine 1 %; 40 mg methylPREDNISolone acetate 40 MG/ML Outcome: tolerated well, no immediate complications Procedure, treatment alternatives, risks and benefits explained, specific risks discussed. Consent was given by the patient. Immediately prior to procedure a time out was called to verify the correct patient, procedure, equipment, support staff and site/side marked as required. Patient was prepped and draped in the usual sterile fashion.     Plan: He will follow-up with [place] as needed.  He understands to wait at least 3 months between cortisone injections.  Questions encouraged and answered at length

## 2020-08-05 ENCOUNTER — Other Ambulatory Visit (HOSPITAL_COMMUNITY): Payer: BC Managed Care – PPO

## 2020-09-01 ENCOUNTER — Other Ambulatory Visit (HOSPITAL_COMMUNITY): Payer: Self-pay | Admitting: Physician Assistant

## 2020-10-06 ENCOUNTER — Other Ambulatory Visit (HOSPITAL_COMMUNITY): Payer: Self-pay | Admitting: Physician Assistant

## 2020-10-07 ENCOUNTER — Encounter: Payer: Self-pay | Admitting: Radiology

## 2020-10-07 ENCOUNTER — Ambulatory Visit (INDEPENDENT_AMBULATORY_CARE_PROVIDER_SITE_OTHER): Payer: BC Managed Care – PPO

## 2020-10-07 ENCOUNTER — Other Ambulatory Visit: Payer: Self-pay

## 2020-10-07 DIAGNOSIS — I4819 Other persistent atrial fibrillation: Secondary | ICD-10-CM

## 2020-10-07 LAB — EXERCISE TOLERANCE TEST
Estimated workload: 7 METS
Exercise duration (min): 6 min
Exercise duration (sec): 0 s
MPHR: 156 {beats}/min
Peak HR: 118 {beats}/min
Percent HR: 75 %
RPE: 17
Rest HR: 69 {beats}/min

## 2020-10-12 ENCOUNTER — Other Ambulatory Visit (HOSPITAL_COMMUNITY): Payer: Self-pay | Admitting: Physician Assistant

## 2020-10-16 ENCOUNTER — Ambulatory Visit (HOSPITAL_COMMUNITY): Payer: BC Managed Care – PPO | Admitting: Physician Assistant

## 2020-10-21 ENCOUNTER — Ambulatory Visit (HOSPITAL_COMMUNITY): Payer: BC Managed Care – PPO | Admitting: Physician Assistant

## 2020-11-05 ENCOUNTER — Other Ambulatory Visit (HOSPITAL_COMMUNITY): Payer: Self-pay | Admitting: Physician Assistant

## 2020-12-03 ENCOUNTER — Other Ambulatory Visit (HOSPITAL_COMMUNITY): Payer: Self-pay | Admitting: Physician Assistant

## 2020-12-11 ENCOUNTER — Other Ambulatory Visit (HOSPITAL_COMMUNITY): Payer: Self-pay | Admitting: Physician Assistant

## 2021-01-04 ENCOUNTER — Other Ambulatory Visit (HOSPITAL_COMMUNITY): Payer: Self-pay | Admitting: Physician Assistant

## 2021-01-09 ENCOUNTER — Other Ambulatory Visit (HOSPITAL_COMMUNITY): Payer: Self-pay | Admitting: Physician Assistant

## 2021-01-13 ENCOUNTER — Other Ambulatory Visit: Payer: Self-pay

## 2021-01-13 ENCOUNTER — Ambulatory Visit (HOSPITAL_COMMUNITY)
Admission: RE | Admit: 2021-01-13 | Discharge: 2021-01-13 | Disposition: A | Discharge status: Home / Self Care | Payer: BC Managed Care – PPO | Source: Ambulatory Visit | Attending: Physician Assistant | Admitting: Physician Assistant

## 2021-01-13 VITALS — BP 128/74 | HR 69 | Ht 70.0 in | Wt 280.4 lb

## 2021-01-13 DIAGNOSIS — Z79899 Other long term (current) drug therapy: Secondary | ICD-10-CM | POA: Insufficient documentation

## 2021-01-13 DIAGNOSIS — I4819 Other persistent atrial fibrillation: Secondary | ICD-10-CM | POA: Diagnosis present

## 2021-01-13 DIAGNOSIS — Z7182 Exercise counseling: Secondary | ICD-10-CM | POA: Insufficient documentation

## 2021-01-13 DIAGNOSIS — Z6841 Body Mass Index (BMI) 40.0 and over, adult: Secondary | ICD-10-CM | POA: Diagnosis not present

## 2021-01-13 DIAGNOSIS — E669 Obesity, unspecified: Secondary | ICD-10-CM | POA: Insufficient documentation

## 2021-01-13 DIAGNOSIS — I10 Essential (primary) hypertension: Secondary | ICD-10-CM | POA: Insufficient documentation

## 2021-01-13 DIAGNOSIS — Z7901 Long term (current) use of anticoagulants: Secondary | ICD-10-CM | POA: Diagnosis not present

## 2021-01-13 MED ORDER — FLECAINIDE ACETATE 100 MG PO TABS: 100.0000 mg | ORAL_TABLET | Freq: Two times a day (BID) | ORAL | 1 refills | Status: DC

## 2021-01-13 MED ORDER — METOPROLOL SUCCINATE ER 25 MG PO TB24: 25.0000 mg | ORAL_TABLET | Freq: Two times a day (BID) | ORAL | 1 refills | Status: DC

## 2021-01-13 MED ORDER — APIXABAN 5 MG PO TABS: 5.0000 mg | ORAL_TABLET | Freq: Two times a day (BID) | ORAL | 1 refills | Status: DC

## 2021-01-13 NOTE — Progress Notes (Signed)
Primary Care Physician: Pcp, No Primary Cardiologist: none Primary Electrophysiologist: none Referring Physician: [facility]   Daryl [name] is a 65 y.o. male with a history of HTN, OSA, and atrial fibrillation who presents for follow up in the [facility].  The patient was initially diagnosed with atrial fibrillation 06/24/20 after presenting to the ED with symptoms of chest presure. ECG showed afib with RVR. He underwent DCCV at that time and his symptoms resolved. Patient was started on Eliquis for a CHADS2VASC score of 1. Patient does have a diagnosis of severe OSA but has not yet had CPAP titration. His echo showed EF 50%, mild MR. Patient is s/p repeat DCCV on 07/14/20 after starting flecainide.   On follow up today, patient reports that he has done well since his last visit. He denies any heart racing or palpitations. He has gained some weight which he attributes to inactivity due to arthritis issues in his knees. He denies any bleeding issues on anticoagulation.   Today, he denies symptoms of palpitations, chest pain, shortness of breath, orthopnea, PND, lower extremity edema, dizziness, presyncope, syncope, bleeding, or neurologic sequela. The patient is tolerating medications without difficulties and is otherwise without complaint today.    Atrial Fibrillation Risk Factors:  he does have symptoms or diagnosis of sleep apnea. he is not compliant with CPAP therapy. Needs titration he does not have a history of rheumatic fever. he does not have a history of alcohol use. The patient does not have a history of early familial atrial fibrillation or other arrhythmias.  he has a BMI of Body mass index is 40.23 kg/m.Marland Kitchen Filed Weights   01/13/21 1540  Weight: 127.2 kg     No family history on file.   Atrial Fibrillation Management history:  Previous antiarrhythmic drugs: flecainide  Previous cardioversions: 06/24/20, 07/14/20 Previous ablations:  none CHADS2VASC score: 1 Anticoagulation history: Eliquis   Past Medical History:  Diagnosis Date   HTN (hypertension)    OSA (obstructive sleep apnea)    Past Surgical History:  Procedure Laterality Date   CARDIOVERSION N/A 07/14/2020   Procedure: CARDIOVERSION;  Surgeon: Christell Constant, MD;  Location: [facility];  Service: Cardiovascular;  Laterality: N/A;    Current Outpatient Medications  Medication Sig Dispense Refill   acetaminophen (TYLENOL) 500 MG tablet Take 500 mg by mouth every 6 (six) hours as needed for mild pain or moderate pain.     apixaban (ELIQUIS) 5 MG TABS tablet Take 1 tablet (5 mg total) by mouth 2 (two) times daily. Appointment Required For Further Refills [phone] 60 tablet 0   flecainide (TAMBOCOR) 100 MG tablet TAKE 1 TABLET BY MOUTH TWICE A DAY 180 tablet 1   hydrochlorothiazide (MICROZIDE) 12.5 MG capsule TAKE 1 CAPSULE BY MOUTH EVERY DAY 90 capsule 3   metoprolol succinate (TOPROL-XL) 25 MG 24 hr tablet Take 1 tablet (25 mg total) by mouth in the morning and at bedtime. Appointment Required For Further Refills [phone] 60 tablet 0   NEOMYCIN-POLYMYXIN-HYDROCORTISONE (CORTISPORIN) 1 % SOLN OTIC solution Place 1 drop into both ears daily as needed (itching).     No current facility-administered medications for this encounter.    No Known Allergies  Social History   Socioeconomic History   Marital status: Married    Spouse name: Not on file   Number of children: Not on file   Years of education: Not on file   Highest education level: Not on file  Occupational History  Not on file  Tobacco Use   Smoking status: Never   Smokeless tobacco: Never  Vaping Use   Vaping Use: Never used  Substance and Sexual Activity   Alcohol use: Not Currently   Drug use: Never   Sexual activity: Not on file  Other Topics Concern   Not on file  Social History Narrative   Not on file   Social Determinants of Health   Financial Resource  Strain: Not on file  Food Insecurity: Not on file  Transportation Needs: Not on file  Physical Activity: Not on file  Stress: Not on file  Social Connections: Not on file  Intimate Partner Violence: Not on file     ROS- All systems are reviewed and negative except as per the HPI above.  Physical Exam: Vitals:   01/13/21 1540  BP: 128/74  Pulse: 69  Weight: 127.2 kg  Height: 5\' 10"  (1.778 m)   GEN- The patient is a well appearing obese male, alert and oriented x 3 today.   HEENT-head normocephalic, atraumatic, sclera clear, conjunctiva pink, hearing intact, trachea midline. Lungs- Clear to ausculation bilaterally, normal work of breathing Heart- Regular rate and rhythm, no murmurs, rubs or gallops  GI- soft, NT, ND, + BS Extremities- no clubbing, cyanosis, or edema MS- no significant deformity or atrophy Skin- no rash or lesion Psych- euthymic mood, full affect Neuro- strength and sensation are intact   Wt Readings from Last 3 Encounters:  01/13/21 127.2 kg  07/18/20 120.9 kg  07/14/20 120.1 kg    EKG today demonstrates  SR Vent. rate 69 BPM PR interval 180 ms QRS duration 120 ms QT/QTcB 414/443 ms  Echo 07/10/20 demonstrated 1. Left ventricular ejection fraction, by estimation, is 50%. The left  ventricle has low normal function. The left ventricle has no regional wall motion abnormalities. Left ventricular diastolic parameters are  indeterminate.   2. Right ventricular systolic function is mildly reduced. The right  ventricular size is normal.   3. The mitral valve is normal in structure. Mild mitral valve  regurgitation.   4. The aortic valve is tricuspid. Aortic valve regurgitation is trivial.  Mild aortic valve sclerosis is present, with no evidence of aortic valve  stenosis.   5. The inferior vena cava is normal in size with greater than 50%  respiratory variability, suggesting right atrial pressure of 3 mmHg.   Epic records are reviewed at length  today  CHA2DS2-VASc Score = 1  The patient's score is based upon: CHF History: 0 HTN History: 1 Diabetes History: 0 Stroke History: 0 Vascular Disease History: 0 Age Score: 0 Gender Score: 0      ASSESSMENT AND PLAN: 1. Persistent atrial fibrillation  The patient's CHA2DS2-VASc score is 1, indicating a 0.6% annual risk of stroke.   Patient appears to be maintaining SR. Continue Toprol 25 mg BID Continue flecainide 100 mg BID Continue Eliquis 5 mg BID. His CV score will soon be 2 when he turns 65.  2. Obesity Body mass index is 40.23 kg/m. Lifestyle modification was discussed and encouraged including regular physical activity and weight reduction.  3. Obstructive sleep apnea CPAP titration not completed.   4. HTN Stable, no changes today.   Follow up in the AF clinic in 6 months.    07/12/20 PA-C Afib Clinic [facility] [address] [place], [place] [phone] 01/13/2021 4:01 PM

## 2021-03-04 ENCOUNTER — Ambulatory Visit (INDEPENDENT_AMBULATORY_CARE_PROVIDER_SITE_OTHER): Payer: BC Managed Care – PPO | Admitting: Physician Assistant

## 2021-03-04 ENCOUNTER — Other Ambulatory Visit: Payer: Self-pay

## 2021-03-04 ENCOUNTER — Encounter: Payer: Self-pay | Admitting: Physician Assistant

## 2021-03-04 VITALS — Ht 69.75 in | Wt 284.6 lb

## 2021-03-04 DIAGNOSIS — M1712 Unilateral primary osteoarthritis, left knee: Secondary | ICD-10-CM | POA: Diagnosis not present

## 2021-03-04 DIAGNOSIS — M1711 Unilateral primary osteoarthritis, right knee: Secondary | ICD-10-CM | POA: Diagnosis not present

## 2021-03-04 NOTE — Progress Notes (Signed)
Office Visit Note   Patient: Daryl [name]           Date of Birth: December 09, 1955           MRN: [id number] Visit Date: 03/04/2021              Requested by: No referring provider defined for this encounter. PCP: Pcp, No   Assessment & Plan: Visit Diagnoses:  1. Unilateral primary osteoarthritis, left knee   2. Unilateral primary osteoarthritis, right knee     Plan: Discussed with patient that he needs to have a BMI under 40.  He will work on weight loss.  Also he will work on Dance movement psychotherapist.  He will discuss with his cardiologist about undergoing knee surgery to make sure they recommend no further work-up from a cardiac standpoint prior to undergoing right total knee arthroplasty.  We will see him back in 3 months for repeat height and weight.  We will also obtain 2 views of both knees at that time as its been years since we x-rayed his knees.  Interim conservative treatment with Tylenol ice/heat and lidocaine patches.  Questions were encouraged and answered at length today.  Interpreter was used to communicate with patient today.  Follow-Up Instructions: Return in about 3 months (around 06/04/2021).   Orders:  No orders of the defined types were placed in this encounter.  No orders of the defined types were placed in this encounter.     Procedures: No procedures performed   Clinical Data: No additional findings.   Subjective: Chief Complaint  Patient presents with   Right Knee - Pain   Left Knee - Pain    HPI Daryl [name] comes in today with bilateral knee pain.  He has known osteoarthritis both knees.  He wants to discuss possible surgery on his right knee as this is bothering him the most at this point time.  He states that on his trip to [place] that the cortisone injections did not last well.  Pain in both knees are affecting his quality of life he and his wife both state that he refrains from doing activities with the family due to pain in the knees.  He is on  chronic anticoagulation due to A. fib and is unable to take NSAIDs.  He is nondiabetic. Review of Systems No fevers chills.  Please see HPI.   Objective: Vital Signs: Ht 5' 9.75" (1.772 m)   Wt 284 lb 9.6 oz (129.1 kg)   BMI 41.13 kg/m   Physical Exam Constitutional:      Appearance: He is not ill-appearing or diaphoretic.  Pulmonary:     Effort: Pulmonary effort is normal.  Neurological:     Mental Status: He is alert and oriented to person, place, and time.  Psychiatric:        Mood and Affect: Mood normal.    Ortho Exam Right knee good range of motion with significant crepitus.  No instability valgus varus stressing.  Calf supple nontender.  No abnormal warmth erythema or effusion right knee.  Specialty Comments:  No specialty comments available.  Imaging: No results found.   PMFS History: Patient Active Problem List   Diagnosis Date Noted   Persistent atrial fibrillation (HCC) 07/04/2020   PAF (paroxysmal atrial fibrillation) (HCC) 07/01/2020   Unilateral primary osteoarthritis, left knee 04/11/2019   Unilateral primary osteoarthritis, right knee 04/11/2019   Past Medical History:  Diagnosis Date   HTN (hypertension)    OSA (obstructive sleep apnea)  History reviewed. No pertinent family history.  Past Surgical History:  Procedure Laterality Date   CARDIOVERSION N/A 07/14/2020   Procedure: CARDIOVERSION;  Surgeon: Werner Lean, MD;  Location: [place] [facility];  Service: Cardiovascular;  Laterality: N/A;   Social History   Occupational History   Not on file  Tobacco Use   Smoking status: Never   Smokeless tobacco: Never  Vaping Use   Vaping Use: Never used  Substance and Sexual Activity   Alcohol use: Not Currently   Drug use: Never   Sexual activity: Not on file

## 2021-06-04 ENCOUNTER — Ambulatory Visit: Payer: BC Managed Care – PPO | Admitting: Orthopaedic Surgery

## 2021-06-29 ENCOUNTER — Other Ambulatory Visit: Payer: Self-pay

## 2021-06-29 ENCOUNTER — Ambulatory Visit (HOSPITAL_COMMUNITY)
Admission: RE | Admit: 2021-06-29 | Discharge: 2021-06-29 | Disposition: A | Discharge status: Home / Self Care | Payer: Medicare (Managed Care) | Source: Ambulatory Visit | Attending: Physician Assistant | Admitting: Physician Assistant

## 2021-06-29 ENCOUNTER — Encounter (HOSPITAL_COMMUNITY): Payer: Self-pay | Admitting: Physician Assistant

## 2021-06-29 VITALS — BP 148/88 | HR 81 | Ht 69.75 in | Wt 284.8 lb

## 2021-06-29 DIAGNOSIS — I4819 Other persistent atrial fibrillation: Secondary | ICD-10-CM | POA: Insufficient documentation

## 2021-06-29 DIAGNOSIS — Z79899 Other long term (current) drug therapy: Secondary | ICD-10-CM | POA: Insufficient documentation

## 2021-06-29 DIAGNOSIS — Z6841 Body Mass Index (BMI) 40.0 and over, adult: Secondary | ICD-10-CM | POA: Insufficient documentation

## 2021-06-29 DIAGNOSIS — I10 Essential (primary) hypertension: Secondary | ICD-10-CM | POA: Insufficient documentation

## 2021-06-29 DIAGNOSIS — Z7901 Long term (current) use of anticoagulants: Secondary | ICD-10-CM | POA: Diagnosis not present

## 2021-06-29 DIAGNOSIS — E669 Obesity, unspecified: Secondary | ICD-10-CM | POA: Insufficient documentation

## 2021-06-29 DIAGNOSIS — D6869 Other thrombophilia: Secondary | ICD-10-CM | POA: Diagnosis not present

## 2021-06-29 DIAGNOSIS — G4733 Obstructive sleep apnea (adult) (pediatric): Secondary | ICD-10-CM | POA: Insufficient documentation

## 2021-06-29 MED ORDER — FLECAINIDE ACETATE 100 MG PO TABS: 100.0000 mg | ORAL_TABLET | Freq: Two times a day (BID) | ORAL | 1 refills | Status: DC

## 2021-06-29 MED ORDER — METOPROLOL SUCCINATE ER 25 MG PO TB24: ORAL_TABLET | ORAL | 1 refills | Status: DC

## 2021-06-29 NOTE — Patient Instructions (Signed)
Increase metoprolol to 50mg in the morning and 25mg in the evening

## 2021-06-29 NOTE — Progress Notes (Addendum)
? ? ?Primary Care Physician: Pcp, No ?Primary Cardiologist: none ?Primary Electrophysiologist: none ?Referring Physician: [facility] ? ? ?Daryl [name] is a 66 y.o. male with a history of HTN, OSA, and atrial fibrillation who presents for follow up in the [facility].  The patient was initially diagnosed with atrial fibrillation 06/24/20 after presenting to the ED with symptoms of chest presure. ECG showed afib with RVR. He underwent DCCV at that time and his symptoms resolved. Patient was started on Eliquis for a CHADS2VASC score of 1. Patient does have a diagnosis of severe OSA but has not yet had CPAP titration. His echo showed EF 50%, mild MR. Patient is s/p repeat DCCV on 07/14/20 after starting flecainide.  ? ?On follow up today, patient reports that last week he had symptoms of low BP, fatigue, and tachypalpitations. His BP machine showed heart rates 110s-120s for one day. There were no specific triggers that he can identify. He is in SR today. His is not on CPAP.  ? ?Today, he denies symptoms of chest pain, shortness of breath, orthopnea, PND, lower extremity edema, dizziness, presyncope, syncope, bleeding, or neurologic sequela. The patient is tolerating medications without difficulties and is otherwise without complaint today.  ? ? ?Atrial Fibrillation Risk Factors: ? ?he does have symptoms or diagnosis of sleep apnea. ?he is not compliant with CPAP therapy. Needs titration ?he does not have a history of rheumatic fever. ?he does not have a history of alcohol use. ?The patient does not have a history of early familial atrial fibrillation or other arrhythmias. ? ?he has a BMI of Body mass index is 41.16 kg/m?Marland KitchenMarland Kitchen ?Filed Weights  ? 06/29/21 1541  ?Weight: 129.2 kg  ? ? ? ? ?No family history on file. ? ? ?Atrial Fibrillation Management history: ? ?Previous antiarrhythmic drugs: flecainide  ?Previous cardioversions: 06/24/20, 07/14/20 ?Previous ablations: none ?CHADS2VASC score:  1 ?Anticoagulation history: Eliquis ? ? ?Past Medical History:  ?Diagnosis Date  ? HTN (hypertension)   ? OSA (obstructive sleep apnea)   ? ?Past Surgical History:  ?Procedure Laterality Date  ? CARDIOVERSION N/A 07/14/2020  ? Procedure: CARDIOVERSION;  Surgeon: Werner Lean, MD;  Location: [facility];  Service: Cardiovascular;  Laterality: N/A;  ? ? ?Current Outpatient Medications  ?Medication Sig Dispense Refill  ? acetaminophen (TYLENOL) 500 MG tablet Take 500 mg by mouth every 6 (six) hours as needed for mild pain or moderate pain.    ? apixaban (ELIQUIS) 5 MG TABS tablet Take 1 tablet (5 mg total) by mouth 2 (two) times daily. 180 tablet 1  ? hydrochlorothiazide (MICROZIDE) 12.5 MG capsule TAKE 1 CAPSULE BY MOUTH EVERY DAY 90 capsule 3  ? NEOMYCIN-POLYMYXIN-HYDROCORTISONE (CORTISPORIN) 1 % SOLN OTIC solution Place 1 drop into both ears daily as needed (itching).    ? flecainide (TAMBOCOR) 100 MG tablet Take 1 tablet (100 mg total) by mouth 2 (two) times daily. 180 tablet 1  ? metoprolol succinate (TOPROL-XL) 25 MG 24 hr tablet Take 50mg  in the morning and 25mg  in the evening 270 tablet 1  ? ?No current facility-administered medications for this encounter.  ? ? ?No Known Allergies ? ?Social History  ? ?Socioeconomic History  ? Marital status: Married  ?  Spouse name: Not on file  ? Number of children: Not on file  ? Years of education: Not on file  ? Highest education level: Not on file  ?Occupational History  ? Not on file  ?Tobacco Use  ? Smoking  status: Never  ? Smokeless tobacco: Never  ? Tobacco comments:  ?  Never smoke 06/29/21  ?Vaping Use  ? Vaping Use: Never used  ?Substance and Sexual Activity  ? Alcohol use: Yes  ?  Alcohol/week: 1.0 - 2.0 standard drink  ?  Types: 1 - 2 Cans of beer per week  ?  Comment: 1 beer socially 06/29/21  ? Drug use: Never  ? Sexual activity: Not on file  ?Other Topics Concern  ? Not on file  ?Social History Narrative  ? Not on file  ? ?Social Determinants of  Health  ? ?Financial Resource Strain: Not on file  ?Food Insecurity: Not on file  ?Transportation Needs: Not on file  ?Physical Activity: Not on file  ?Stress: Not on file  ?Social Connections: Not on file  ?Intimate Partner Violence: Not on file  ? ? ? ?ROS- All systems are reviewed and negative except as per the HPI above. ? ?Physical Exam: ?Vitals:  ? 06/29/21 1541  ?BP: (!) 148/88  ?Pulse: 81  ?Weight: 129.2 kg  ?Height: 5' 9.75" (1.772 m)  ? ? ?GEN- The patient is a well appearing obese male, alert and oriented x 3 today.   ?HEENT-head normocephalic, atraumatic, sclera clear, conjunctiva pink, hearing intact, trachea midline. ?Lungs- Clear to ausculation bilaterally, normal work of breathing ?Heart- Regular rate and rhythm, occasional ectopic beat, no murmurs, rubs or gallops  ?GI- soft, NT, ND, + BS ?Extremities- no clubbing, cyanosis, or edema ?MS- no significant deformity or atrophy ?Skin- no rash or lesion ?Psych- euthymic mood, full affect ?Neuro- strength and sensation are intact ? ? ?Wt Readings from Last 3 Encounters:  ?06/29/21 129.2 kg  ?03/04/21 129.1 kg  ?01/13/21 127.2 kg  ? ? ?EKG today demonstrates  ?SR, PACs ?Vent. rate 81 BPM ?PR interval 174 ms ?QRS duration 112 ms ?QT/QTcB 386/448 ms ? ?Echo 07/10/20 demonstrated ?1. Left ventricular ejection fraction, by estimation, is 50%. The left  ?ventricle has low normal function. The left ventricle has no regional wall motion abnormalities. Left ventricular diastolic parameters are  ?indeterminate.  ? 2. Right ventricular systolic function is mildly reduced. The right  ?ventricular size is normal.  ? 3. The mitral valve is normal in structure. Mild mitral valve  ?regurgitation.  ? 4. The aortic valve is tricuspid. Aortic valve regurgitation is trivial.  ?Mild aortic valve sclerosis is present, with no evidence of aortic valve  ?stenosis.  ? 5. The inferior vena cava is normal in size with greater than 50%  ?respiratory variability, suggesting right  atrial pressure of 3 mmHg. ? ? ?Epic records are reviewed at length today ? ?CHA2DS2-VASc Score = 2  ?The patient's score is based upon: ?CHF History: 0 ?HTN History: 1 ?Diabetes History: 0 ?Stroke History: 0 ?Vascular Disease History: 0 ?Age Score: 1 ?Gender Score: 0 ?    ? ? ?ASSESSMENT AND PLAN: ?1. Persistent Atrial Fibrillation (ICD10:  I48.19) ?The patient's CHA2DS2-VASc score is 2, indicating a 2.2% annual risk of stroke.   ?Patient back in SR. Suspect related to weight and untreated severe OSA. Will focus on aggressive lifestyle modification.  ?Increase Toprol to 50 mg AM and 25 mg PM. ?Continue flecainide 100 mg BID ?Continue Eliquis 5 mg BID ? ?2. Secondary Hypercoagulable State (ICD10:  D68.69) ?The patient is at significant risk for stroke/thromboembolism based upon his CHA2DS2-VASc Score of 2.  Continue Apixaban (Eliquis).  ?  ?3. Obesity ?Body mass index is 41.16 kg/m?. ?Lifestyle modification was discussed and encouraged  including regular physical activity and weight reduction. ?Will refer to [facility] ?Refer to [facility].  ? ?4. Obstructive sleep apnea ?Patient not on CPAP. Diagnosed with severe OSA 11/05/19 ?Will refer to Dr Maxwell Caul.  ? ?5. HTN ?Borderline elevated today. ?Increase BB as above.  ? ? ?Follow up in the AF clinic in one month.  ? ? ?Ricky Elly Haffey PA-C ?Afib Clinic ?[facility] ?[address] ?[place], [place] [address] ?[phone] ?06/29/2021 ?4:30 PM ?

## 2021-07-03 ENCOUNTER — Telehealth: Payer: Self-pay

## 2021-07-03 NOTE — Telephone Encounter (Signed)
Call to pt reference PREP referral ?Currently asked to call back after work ?If he reaches vm, asked that he leave the best time to call back ?

## 2021-07-14 ENCOUNTER — Ambulatory Visit (HOSPITAL_COMMUNITY): Payer: BC Managed Care – PPO | Admitting: Physician Assistant

## 2021-07-28 ENCOUNTER — Ambulatory Visit (HOSPITAL_COMMUNITY)
Admission: RE | Admit: 2021-07-28 | Discharge: 2021-07-28 | Disposition: A | Discharge status: Home / Self Care | Payer: Medicare (Managed Care) | Source: Ambulatory Visit | Attending: Physician Assistant | Admitting: Physician Assistant

## 2021-07-28 ENCOUNTER — Encounter (HOSPITAL_COMMUNITY): Payer: Self-pay | Admitting: Physician Assistant

## 2021-07-28 VITALS — BP 132/90 | HR 58 | Ht 69.75 in | Wt 287.8 lb

## 2021-07-28 DIAGNOSIS — Z6841 Body Mass Index (BMI) 40.0 and over, adult: Secondary | ICD-10-CM | POA: Insufficient documentation

## 2021-07-28 DIAGNOSIS — Z7901 Long term (current) use of anticoagulants: Secondary | ICD-10-CM | POA: Insufficient documentation

## 2021-07-28 DIAGNOSIS — Z79899 Other long term (current) drug therapy: Secondary | ICD-10-CM | POA: Diagnosis not present

## 2021-07-28 DIAGNOSIS — I4819 Other persistent atrial fibrillation: Secondary | ICD-10-CM

## 2021-07-28 DIAGNOSIS — G4733 Obstructive sleep apnea (adult) (pediatric): Secondary | ICD-10-CM | POA: Insufficient documentation

## 2021-07-28 DIAGNOSIS — I10 Essential (primary) hypertension: Secondary | ICD-10-CM | POA: Diagnosis not present

## 2021-07-28 DIAGNOSIS — D6869 Other thrombophilia: Secondary | ICD-10-CM | POA: Diagnosis not present

## 2021-07-28 DIAGNOSIS — E669 Obesity, unspecified: Secondary | ICD-10-CM | POA: Insufficient documentation

## 2021-07-28 LAB — BASIC METABOLIC PANEL
Anion gap: 10 (ref 5–15)
BUN: 10 mg/dL (ref 8–23)
CO2: 25 mmol/L (ref 22–32)
Calcium: 9 mg/dL (ref 8.9–10.3)
Chloride: 103 mmol/L (ref 98–111)
Creatinine, Ser: 1.08 mg/dL (ref 0.61–1.24)
GFR, Estimated: >60 mL/min (ref >60)
Glucose, Bld: 125 mg/dL — ABNORMAL HIGH (ref 70–99)
Potassium: 3.6 mmol/L (ref 3.5–5.1)
Sodium: 138 mmol/L (ref 135–145)

## 2021-07-28 LAB — CBC
HCT: 46.9 % (ref 39.0–52.0)
Hemoglobin: 15.1 g/dL (ref 13.0–17.0)
MCH: 28.8 pg (ref 26.0–34.0)
MCHC: 32.2 g/dL (ref 30.0–36.0)
MCV: 89.3 fL (ref 80.0–100.0)
Platelets: 249 10*3/uL (ref 150–400)
RBC: 5.25 MIL/uL (ref 4.22–5.81)
RDW: 12.9 % (ref 11.5–15.5)
WBC: 7.5 10*3/uL (ref 4.0–10.5)
nRBC: 0 % (ref 0.0–0.2)

## 2021-07-28 NOTE — Progress Notes (Signed)
? ? ?Primary Care Physician: Pcp, No ?Primary Cardiologist: none ?Primary Electrophysiologist: none ?Referring Physician: [facility] ? ? ?Daryl [name] is a 66 y.o. male with a history of HTN, OSA, and atrial fibrillation who presents for follow up in the [facility].  The patient was initially diagnosed with atrial fibrillation 06/24/20 after presenting to the ED with symptoms of chest presure. ECG showed afib with RVR. He underwent DCCV at that time and his symptoms resolved. Patient was started on Eliquis for a CHADS2VASC score of 1. Patient does have a diagnosis of severe OSA but has not yet had CPAP titration. His echo showed EF 50%, mild MR. Patient is s/p repeat DCCV on 07/14/20 after starting flecainide.  ? ?On follow up today, patient reports that he has done well since his last visit with no heart racing or palpitations. He has been seen at [facility] to establish care with sleep medicine. No bleeding issues with anticoagulation.  ? ?Today, he denies symptoms of palpitations, chest pain, shortness of breath, orthopnea, PND, lower extremity edema, dizziness, presyncope, syncope, bleeding, or neurologic sequela. The patient is tolerating medications without difficulties and is otherwise without complaint today.  ? ? ?Atrial Fibrillation Risk Factors: ? ?he does have symptoms or diagnosis of sleep apnea. ?he is not compliant with CPAP therapy. Needs titration ?he does not have a history of rheumatic fever. ?he does not have a history of alcohol use. ?The patient does not have a history of early familial atrial fibrillation or other arrhythmias. ? ?he has a BMI of Body mass index is 41.59 kg/m?Marland KitchenMarland Kitchen ?Filed Weights  ? 07/28/21 1510  ?Weight: 130.5 kg  ? ? ?No family history on file. ? ? ?Atrial Fibrillation Management history: ? ?Previous antiarrhythmic drugs: flecainide  ?Previous cardioversions: 06/24/20, 07/14/20 ?Previous ablations: none ?CHADS2VASC score: 1 ?Anticoagulation history:  Eliquis ? ? ?Past Medical History:  ?Diagnosis Date  ? HTN (hypertension)   ? OSA (obstructive sleep apnea)   ? ?Past Surgical History:  ?Procedure Laterality Date  ? CARDIOVERSION N/A 07/14/2020  ? Procedure: CARDIOVERSION;  Surgeon: Werner Lean, MD;  Location: [facility];  Service: Cardiovascular;  Laterality: N/A;  ? ? ?Current Outpatient Medications  ?Medication Sig Dispense Refill  ? acetaminophen (TYLENOL) 500 MG tablet Take 500 mg by mouth every 6 (six) hours as needed for mild pain or moderate pain.    ? apixaban (ELIQUIS) 5 MG TABS tablet Take 1 tablet (5 mg total) by mouth 2 (two) times daily. 180 tablet 1  ? flecainide (TAMBOCOR) 100 MG tablet Take 1 tablet (100 mg total) by mouth 2 (two) times daily. 180 tablet 1  ? hydrochlorothiazide (MICROZIDE) 12.5 MG capsule TAKE 1 CAPSULE BY MOUTH EVERY DAY 90 capsule 3  ? metoprolol succinate (TOPROL-XL) 25 MG 24 hr tablet Take 50mg  in the morning and 25mg  in the evening 270 tablet 1  ? NEOMYCIN-POLYMYXIN-HYDROCORTISONE (CORTISPORIN) 1 % SOLN OTIC solution Place 1 drop into both ears daily as needed (itching).    ? ?No current facility-administered medications for this encounter.  ? ? ?No Known Allergies ? ?Social History  ? ?Socioeconomic History  ? Marital status: Married  ?  Spouse name: Not on file  ? Number of children: Not on file  ? Years of education: Not on file  ? Highest education level: Not on file  ?Occupational History  ? Not on file  ?Tobacco Use  ? Smoking status: Never  ? Smokeless tobacco: Never  ? Tobacco  comments:  ?  Never smoke 06/29/21  ?Vaping Use  ? Vaping Use: Never used  ?Substance and Sexual Activity  ? Alcohol use: Yes  ?  Alcohol/week: 1.0 - 2.0 standard drink  ?  Types: 1 - 2 Cans of beer per week  ?  Comment: 1 beer socially 06/29/21  ? Drug use: Never  ? Sexual activity: Not on file  ?Other Topics Concern  ? Not on file  ?Social History Narrative  ? Not on file  ? ?Social Determinants of Health  ? ?Financial Resource  Strain: Not on file  ?Food Insecurity: Not on file  ?Transportation Needs: Not on file  ?Physical Activity: Not on file  ?Stress: Not on file  ?Social Connections: Not on file  ?Intimate Partner Violence: Not on file  ? ? ? ?ROS- All systems are reviewed and negative except as per the HPI above. ? ?Physical Exam: ?Vitals:  ? 07/28/21 1510  ?BP: 132/90  ?Pulse: (!) 58  ?Weight: 130.5 kg  ?Height: 5' 9.75" (1.772 m)  ? ? ? ?GEN- The patient is a well appearing obese male, alert and oriented x 3 today.   ?HEENT-head normocephalic, atraumatic, sclera clear, conjunctiva pink, hearing intact, trachea midline. ?Lungs- Clear to ausculation bilaterally, normal work of breathing ?Heart- Regular rate and rhythm, no murmurs, rubs or gallops  ?GI- soft, NT, ND, + BS ?Extremities- no clubbing, cyanosis, or edema ?MS- no significant deformity or atrophy ?Skin- no rash or lesion ?Psych- euthymic mood, full affect ?Neuro- strength and sensation are intact ? ? ?Wt Readings from Last 3 Encounters:  ?07/28/21 130.5 kg  ?06/29/21 129.2 kg  ?03/04/21 129.1 kg  ? ? ?EKG today demonstrates  ?SB ?Vent. rate 58 BPM ?PR interval 198 ms ?QRS duration 124 ms ?QT/QTcB 438/429 ms ? ?Echo 07/10/20 demonstrated ?1. Left ventricular ejection fraction, by estimation, is 50%. The left  ?ventricle has low normal function. The left ventricle has no regional wall motion abnormalities. Left ventricular diastolic parameters are  ?indeterminate.  ? 2. Right ventricular systolic function is mildly reduced. The right  ?ventricular size is normal.  ? 3. The mitral valve is normal in structure. Mild mitral valve  ?regurgitation.  ? 4. The aortic valve is tricuspid. Aortic valve regurgitation is trivial.  ?Mild aortic valve sclerosis is present, with no evidence of aortic valve  ?stenosis.  ? 5. The inferior vena cava is normal in size with greater than 50%  ?respiratory variability, suggesting right atrial pressure of 3 mmHg. ? ? ?Epic records are reviewed at  length today ? ?CHA2DS2-VASc Score = 2  ?The patient's score is based upon: ?CHF History: 0 ?HTN History: 1 ?Diabetes History: 0 ?Stroke History: 0 ?Vascular Disease History: 0 ?Age Score: 1 ?Gender Score: 0 ?    ? ? ?ASSESSMENT AND PLAN: ?1. Persistent Atrial Fibrillation (ICD10:  I48.19) ?The patient's CHA2DS2-VASc score is 2, indicating a 2.2% annual risk of stroke.   ?Patient appears to be maintaining SR.  ?Suspect related to weight and untreated severe OSA.  ?Continue Toprol 50 mg AM and 25 mg PM ?Continue flecainide 100 mg BID ?Continue Eliquis 5 mg BID ? ?2. Secondary Hypercoagulable State (ICD10:  D68.69) ?The patient is at significant risk for stroke/thromboembolism based upon his CHA2DS2-VASc Score of 2.  Continue Apixaban (Eliquis).  ?  ?3. Obesity ?Body mass index is 41.59 kg/m?. ?Lifestyle modification was discussed and encouraged including regular physical activity and weight reduction. ?Referred to [facility] ?Ref[facility] to [facility].  ? ?4. Obstructive sleep apnea ?Diagnosed with severe OSA 11/05/19 ?Referred to [facility] sleep medicine.  ?Patient being scheduled for repeat sleep study/CPAP titration.  ? ?5. HTN ?Stable, no changes today.  ? ? ?Follow up in the AF clinic in 3 months.  ? ? ?Ricky Emiya Loomer PA-C ?Afib Clinic ?[facility] ?[address] ?[place],  [address] ?[phone] ?07/28/2021 ?3:18 PM ?

## 2021-08-08 LAB — COLOGUARD: COLOGUARD: NEGATIVE

## 2021-09-21 ENCOUNTER — Ambulatory Visit: Payer: Self-pay | Admitting: Orthopaedic Surgery

## 2021-09-25 ENCOUNTER — Other Ambulatory Visit (HOSPITAL_COMMUNITY): Payer: Self-pay | Admitting: *Deleted

## 2021-09-25 MED ORDER — APIXABAN 5 MG PO TABS: 5.0000 mg | ORAL_TABLET | Freq: Two times a day (BID) | ORAL | 1 refills | Status: DC

## 2021-09-25 MED ORDER — HYDROCHLOROTHIAZIDE 12.5 MG PO CAPS
ORAL_CAPSULE | ORAL | 1 refills | Status: DC
Start: 2021-09-25 — End: 2021-09-28

## 2021-09-28 ENCOUNTER — Other Ambulatory Visit (HOSPITAL_COMMUNITY): Payer: Self-pay

## 2021-09-28 MED ORDER — HYDROCHLOROTHIAZIDE 12.5 MG PO CAPS: ORAL_CAPSULE | ORAL | 1 refills | Status: DC

## 2021-09-28 MED ORDER — APIXABAN 5 MG PO TABS: 5.0000 mg | ORAL_TABLET | Freq: Two times a day (BID) | ORAL | 1 refills | Status: DC

## 2021-10-11 ENCOUNTER — Other Ambulatory Visit (HOSPITAL_COMMUNITY): Payer: Self-pay | Admitting: Physician Assistant

## 2021-10-29 ENCOUNTER — Encounter (HOSPITAL_COMMUNITY): Payer: Self-pay | Admitting: Physician Assistant

## 2021-10-29 ENCOUNTER — Ambulatory Visit (HOSPITAL_COMMUNITY)
Admission: RE | Admit: 2021-10-29 | Discharge: 2021-10-29 | Disposition: A | Discharge status: Home / Self Care | Payer: Medicare (Managed Care) | Source: Ambulatory Visit | Attending: Physician Assistant | Admitting: Physician Assistant

## 2021-10-29 VITALS — BP 124/76 | HR 68 | Ht 69.75 in | Wt 292.6 lb

## 2021-10-29 DIAGNOSIS — Z7901 Long term (current) use of anticoagulants: Secondary | ICD-10-CM | POA: Diagnosis not present

## 2021-10-29 DIAGNOSIS — Z79899 Other long term (current) drug therapy: Secondary | ICD-10-CM | POA: Diagnosis not present

## 2021-10-29 DIAGNOSIS — G4733 Obstructive sleep apnea (adult) (pediatric): Secondary | ICD-10-CM | POA: Insufficient documentation

## 2021-10-29 DIAGNOSIS — D6869 Other thrombophilia: Secondary | ICD-10-CM | POA: Diagnosis not present

## 2021-10-29 DIAGNOSIS — I10 Essential (primary) hypertension: Secondary | ICD-10-CM | POA: Diagnosis not present

## 2021-10-29 DIAGNOSIS — E669 Obesity, unspecified: Secondary | ICD-10-CM | POA: Insufficient documentation

## 2021-10-29 DIAGNOSIS — I480 Paroxysmal atrial fibrillation: Secondary | ICD-10-CM

## 2021-10-29 DIAGNOSIS — Z6841 Body Mass Index (BMI) 40.0 and over, adult: Secondary | ICD-10-CM | POA: Diagnosis not present

## 2021-10-29 DIAGNOSIS — I4819 Other persistent atrial fibrillation: Secondary | ICD-10-CM | POA: Diagnosis present

## 2021-10-29 NOTE — Patient Instructions (Signed)
[facility]   [address] [place], [place] [address]  [phone]  

## 2021-10-29 NOTE — Progress Notes (Signed)
Primary Care Physician: Pcp, No Primary Cardiologist: none Primary Electrophysiologist: none Referring Physician: [facility]   Daryl [name] is a 66 y.o. male with a history of HTN, OSA, and atrial fibrillation who presents for follow up in the [facility].  The patient was initially diagnosed with atrial fibrillation 06/24/20 after presenting to the ED with symptoms of chest presure. ECG showed afib with RVR. He underwent DCCV at that time and his symptoms resolved. Patient was started on Eliquis for a CHADS2VASC score of 2. Patient does have a diagnosis of severe OSA but has not yet had CPAP titration. His echo showed EF 50%, mild MR. Patient is s/p repeat DCCV on 07/14/20 after starting flecainide.   On follow up today, patient reports that he has done well from a cardiac standpoint. He has not had any tachypalpitations. He is still working with [facility] medicine on getting CPAP. No bleeding issues on anticoagulation.   Today, he denies symptoms of palpitations, chest pain, shortness of breath, orthopnea, PND, lower extremity edema, dizziness, presyncope, syncope, bleeding, or neurologic sequela. The patient is tolerating medications without difficulties and is otherwise without complaint today.    Atrial Fibrillation Risk Factors:  he does have symptoms or diagnosis of sleep apnea. he is not compliant with CPAP therapy. Waiting on CPAP. he does not have a history of rheumatic fever. he does not have a history of alcohol use. The patient does not have a history of early familial atrial fibrillation or other arrhythmias.  he has a BMI of Body mass index is 42.29 kg/m.Marland Kitchen Filed Weights   10/29/21 1535  Weight: 132.7 kg    No family history on file.   Atrial Fibrillation Management history:  Previous antiarrhythmic drugs: flecainide  Previous cardioversions: 06/24/20, 07/14/20 Previous ablations: none CHADS2VASC score: 2 Anticoagulation history:  Eliquis   Past Medical History:  Diagnosis Date   HTN (hypertension)    OSA (obstructive sleep apnea)    Past Surgical History:  Procedure Laterality Date   CARDIOVERSION N/A 07/14/2020   Procedure: CARDIOVERSION;  Surgeon: Christell Constant, MD;  Location: [facility];  Service: Cardiovascular;  Laterality: N/A;    Current Outpatient Medications  Medication Sig Dispense Refill   acetaminophen (TYLENOL) 500 MG tablet Take 500 mg by mouth every 6 (six) hours as needed for mild pain or moderate pain.     apixaban (ELIQUIS) 5 MG TABS tablet Take 1 tablet (5 mg total) by mouth 2 (two) times daily. 180 tablet 1   flecainide (TAMBOCOR) 100 MG tablet Take 1 tablet (100 mg total) by mouth 2 (two) times daily. 180 tablet 1   hydrochlorothiazide (MICROZIDE) 12.5 MG capsule TAKE 1 CAPSULE BY MOUTH EVERY DAY 90 capsule 3   metoprolol succinate (TOPROL-XL) 25 MG 24 hr tablet Take 50mg  in the morning and 25mg  in the evening 270 tablet 1   NEOMYCIN-POLYMYXIN-HYDROCORTISONE (CORTISPORIN) 1 % SOLN OTIC solution Place 1 drop into both ears daily as needed (itching).     No current facility-administered medications for this encounter.    No Known Allergies  Social History   Socioeconomic History   Marital status: Married    Spouse name: Not on file   Number of children: Not on file   Years of education: Not on file   Highest education level: Not on file  Occupational History   Not on file  Tobacco Use   Smoking status: Never   Smokeless tobacco: Never   Tobacco  comments:    Never smoke 06/29/21  Vaping Use   Vaping Use: Never used  Substance and Sexual Activity   Alcohol use: Yes    Alcohol/week: 1.0 - 2.0 standard drink of alcohol    Types: 1 - 2 Cans of beer per week    Comment: 1 beer socially 06/29/21   Drug use: Never   Sexual activity: Not on file  Other Topics Concern   Not on file  Social History Narrative   Not on file   Social Determinants of Health   Financial  Resource Strain: Not on file  Food Insecurity: Not on file  Transportation Needs: Not on file  Physical Activity: Not on file  Stress: Not on file  Social Connections: Not on file  Intimate Partner Violence: Not on file     ROS- All systems are reviewed and negative except as per the HPI above.  Physical Exam: Vitals:   10/29/21 1535  BP: 124/76  Pulse: 68  Weight: 132.7 kg  Height: 5' 9.75" (1.772 m)     GEN- The patient is a well appearing obese male, alert and oriented x 3 today.   HEENT-head normocephalic, atraumatic, sclera clear, conjunctiva pink, hearing intact, trachea midline. Lungs- Clear to ausculation bilaterally, normal work of breathing Heart- Regular rate and rhythm, no murmurs, rubs or gallops  GI- soft, NT, ND, + BS Extremities- no clubbing, cyanosis, or edema MS- no significant deformity or atrophy Skin- no rash or lesion Psych- euthymic mood, full affect Neuro- strength and sensation are intact   Wt Readings from Last 3 Encounters:  10/29/21 132.7 kg  07/28/21 130.5 kg  06/29/21 129.2 kg    EKG today demonstrates  SR Vent. rate 68 BPM PR interval 186 ms QRS duration 120 ms QT/QTcB 410/435 ms  Echo 07/10/20 demonstrated 1. Left ventricular ejection fraction, by estimation, is 50%. The left  ventricle has low normal function. The left ventricle has no regional wall motion abnormalities. Left ventricular diastolic parameters are  indeterminate.   2. Right ventricular systolic function is mildly reduced. The right  ventricular size is normal.   3. The mitral valve is normal in structure. Mild mitral valve  regurgitation.   4. The aortic valve is tricuspid. Aortic valve regurgitation is trivial.  Mild aortic valve sclerosis is present, with no evidence of aortic valve  stenosis.   5. The inferior vena cava is normal in size with greater than 50%  respiratory variability, suggesting right atrial pressure of 3 mmHg.   Epic records are reviewed  at length today  CHA2DS2-VASc Score = 2  The patient's score is based upon: CHF History: 0 HTN History: 1 Diabetes History: 0 Stroke History: 0 Vascular Disease History: 0 Age Score: 1 Gender Score: 0       ASSESSMENT AND PLAN: 1. Persistent Atrial Fibrillation (ICD10:  I48.19) The patient's CHA2DS2-VASc score is 2, indicating a 2.2% annual risk of stroke.   Patient appears to be maintaining SR. Continue Toprol 50 mg AM and 25 mg PM Continue flecainide 100 mg BID Continue Eliquis 5 mg BID  2. Secondary Hypercoagulable State (ICD10:  D68.69) The patient is at significant risk for stroke/thromboembolism based upon his CHA2DS2-VASc Score of 2.  Continue Apixaban (Eliquis).    3. Obesity Body mass index is 42.29 kg/m. Lifestyle modification was discussed and encouraged including regular physical activity and weight reduction. Referred to [facility] Referred to [facility].   4. Obstructive sleep apnea Diagnosed with  severe OSA 11/05/19 Followed by [facility].  Still working with insurance to get CPAP.  5. HTN Stable, no changes today.    Follow up in the AF clinic in 6 months. Will also refer to establish care with a primary cardiologist.    Jorja Loa PA-C [facility] Clinic [facility] [address] [place], [place] [address] [phone] 10/29/2021 3:57 PM

## 2021-11-08 IMAGING — DX DG CHEST 1V PORT
1 series · 1 of 1 positions shown · non-contrast
Comparison: None.

CLINICAL DATA: Chest pain.

EXAM:
PORTABLE CHEST 1 VIEW

[chest ap]
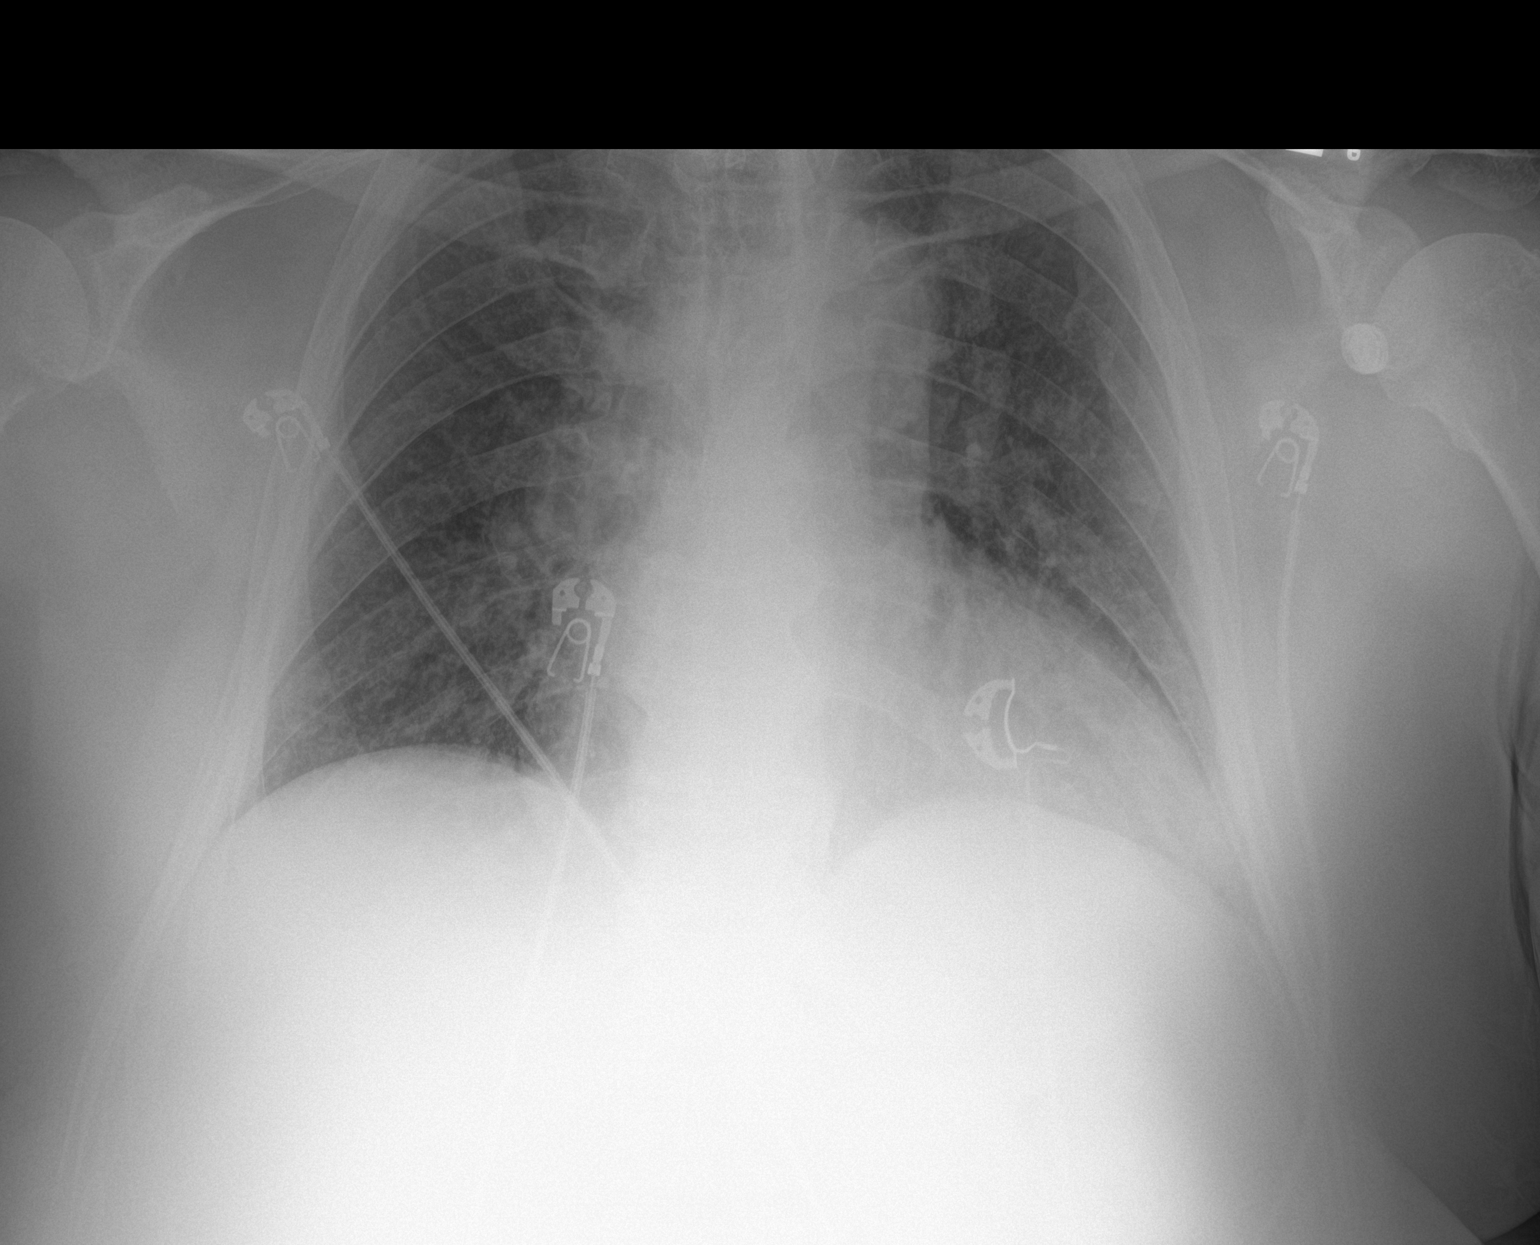

[1 of 1 positions shown; findings below may reference images not displayed]

FINDINGS: Mild, diffusely increased interstitial lung markings are seen. There
is no evidence of focal consolidation, pleural effusion or
pneumothorax. The cardiac silhouette is borderline in size.
Degenerative changes seen throughout the thoracic spine.
IMPRESSION: Increased interstitial lung markings which may be, in part, chronic
in nature. A mild superimposed component of interstitial edema
cannot be excluded.

## 2021-11-20 ENCOUNTER — Ambulatory Visit (INDEPENDENT_AMBULATORY_CARE_PROVIDER_SITE_OTHER): Payer: Medicare (Managed Care) | Admitting: Interventional Cardiology

## 2021-11-20 ENCOUNTER — Encounter: Payer: Self-pay | Admitting: Interventional Cardiology

## 2021-11-20 VITALS — BP 146/94 | HR 72 | Ht 70.0 in | Wt 297.0 lb

## 2021-11-20 DIAGNOSIS — I10 Essential (primary) hypertension: Secondary | ICD-10-CM | POA: Diagnosis not present

## 2021-11-20 DIAGNOSIS — G4733 Obstructive sleep apnea (adult) (pediatric): Secondary | ICD-10-CM

## 2021-11-20 DIAGNOSIS — D6869 Other thrombophilia: Secondary | ICD-10-CM | POA: Diagnosis not present

## 2021-11-20 DIAGNOSIS — I480 Paroxysmal atrial fibrillation: Secondary | ICD-10-CM

## 2021-11-20 MED ORDER — NEOMYCIN-POLYMYXIN-HC 1 % OT SOLN: 1.0000 [drp] | Freq: Every day | OTIC | 3 refills | Status: AC | PRN

## 2021-11-20 NOTE — Patient Instructions (Signed)
Medication Instructions:  Your physician recommends that you continue on your current medications as directed. Please refer to the Current Medication list given to you today.  *If you need a refill on your cardiac medications before your next appointment, please call your pharmacy*   Lab Work: none If you have labs (blood work) drawn today and your tests are completely normal, you will receive your results only by: MyChart Message (if you have MyChart) OR A paper copy in the mail If you have any lab test that is abnormal or we need to change your treatment, we will call you to review the results.   Testing/Procedures: Dr Eldridge Dace recommends you have a Calcium Score CT scan   Follow-Up: At [facility], you and your health needs are our priority.  As part of our continuing mission to provide you with exceptional heart care, we have created designated Provider Care Teams.  These Care Teams include your primary Cardiologist (physician) and Advanced Practice Providers (APPs -  Physician Assistants and Nurse Practitioners) who all work together to provide you with the care you need, when you need it.  We recommend signing up for the patient portal called "MyChart".  Sign up information is provided on this After Visit Summary.  MyChart is used to connect with patients for Virtual Visits (Telemedicine).  Patients are able to view lab/test results, encounter notes, upcoming appointments, etc.  Non-urgent messages can be sent to your provider as well.   To learn more about what you can do with MyChart, go to [email or web address].    Your next appointment:   12 month(s)  The format for your next appointment:   In Person  Provider:   Lance Muss, MD     Other Instructions    Important Information About Sugar

## 2021-11-20 NOTE — Progress Notes (Signed)
Cardiology Office Note   Date:  11/20/2021   ID:  Daryl [name], DOB Mar 17, 1956, MRN [id number]  PCP:  Pcp, No    No chief complaint on file.  AFib  Wt Readings from Last 3 Encounters:  11/20/21 297 lb (134.7 kg)  10/29/21 292 lb 9.6 oz (132.7 kg)  07/28/21 287 lb 12.8 oz (130.5 kg)       History of Present Illness: Daryl [name] is a 66 y.o. male  with AFib, "initially diagnosed with atrial fibrillation 06/24/20 after presenting to the ED with symptoms of chest presure. ECG showed afib with RVR. He underwent DCCV at that time and his symptoms resolved. Patient was started on Eliquis for a CHADS2VASC score of 2. Patient does have a diagnosis of severe OSA but has not yet had CPAP titration. His echo showed EF 50%, mild MR. Patient is s/p repeat DCCV on 07/14/20 after starting flecainide.    Was getting CPAP through [facility].   Denies : Chest pain. Dizziness. Leg edema. Nitroglycerin use. Orthopnea. Palpitations. Paroxysmal nocturnal dyspnea. Shortness of breath. Syncope.    He is concerned about his weight.     Past Medical History:  Diagnosis Date   HTN (hypertension)    OSA (obstructive sleep apnea)     Past Surgical History:  Procedure Laterality Date   CARDIOVERSION N/A 07/14/2020   Procedure: CARDIOVERSION;  Surgeon: Christell Constant, MD;  Location: [facility];  Service: Cardiovascular;  Laterality: N/A;     Current Outpatient Medications  Medication Sig Dispense Refill   acetaminophen (TYLENOL) 500 MG tablet Take 500 mg by mouth every 6 (six) hours as needed for mild pain or moderate pain.     apixaban (ELIQUIS) 5 MG TABS tablet Take 1 tablet (5 mg total) by mouth 2 (two) times daily. 180 tablet 1   flecainide (TAMBOCOR) 100 MG tablet Take 1 tablet (100 mg total) by mouth 2 (two) times daily. 180 tablet 1   hydrochlorothiazide (MICROZIDE) 12.5 MG capsule TAKE 1 CAPSULE BY MOUTH EVERY DAY 90 capsule 3   metoprolol succinate (TOPROL-XL) 25 MG 24 hr tablet  Take 50mg  in the morning and 25mg  in the evening 270 tablet 1   NEOMYCIN-POLYMYXIN-HYDROCORTISONE (CORTISPORIN) 1 % SOLN OTIC solution Place 1 drop into both ears daily as needed (itching).     No current facility-administered medications for this visit.    Allergies:   Patient has no known allergies.    Social History:  The patient  reports that he has never smoked. He has never used smokeless tobacco. He reports current alcohol use of about 1.0 - 2.0 standard drink of alcohol per week. He reports that he does not use drugs.   Family History:  The patient's family history includes AAA (abdominal aortic aneurysm) in his brother and mother; Obesity in his mother.    ROS:  Please see the history of present illness.   Otherwise, review of systems are positive for difficulty losing weight.   All other systems are reviewed and negative.    PHYSICAL EXAM: VS:  BP (!) 146/94   Pulse 72   Ht 5\' 10"  (1.778 m)   Wt 297 lb (134.7 kg)   SpO2 93%   BMI 42.62 kg/m  , BMI Body mass index is 42.62 kg/m. GEN: Well nourished, well developed, in no acute distress HEENT: normal Neck: no JVD, carotid bruits, or masses Cardiac: RRR; no murmurs, rubs, or gallops,no edema  Respiratory:  clear to auscultation bilaterally, normal  work of breathing GI: soft, nontender, nondistended, + BS MS: no deformity or atrophy Skin: warm and dry, no rash Neuro:  Strength and sensation are intact Psych: euthymic mood, full affect   EKG:   The ekg ordered today demonstrated NSR, normal ECG   Recent Labs: 07/28/2021: BUN 10; Creatinine, Ser 1.08; Hemoglobin 15.1; Platelets 249; Potassium 3.6; Sodium 138   Lipid Panel No results found for: "CHOL", "TRIG", "HDL", "CHOLHDL", "VLDL", "LDLCALC", "LDLDIRECT"   Other studies Reviewed: Additional studies/ records that were reviewed today with results demonstrating: labs reviewed.   ASSESSMENT AND PLAN:  AFib: Maintaining NSR on flecainide.  No  palpitations. Morbid obesity: Not exercising much.  He is interested in weight loss.  Will refer to PharmD.  He will need fasting lipids checked at some point. Anticoagulated: No bleeding issues.  Hemoglobin 15.1 in April 2023. OSA: Severe, diagnosed in July 2021.  Followed by Deboraha Sprang.    Just started CPAP.  Hypertension: Low salt diet.  Weight loss.  Increase exercise.  Home readings are well controlled.   I personally reviewed them.  No change today. Recommended calcium scoring CT. he is agreeable. I refilled his eardrops as he does not have a primary care physician at this time.   Current medicines are reviewed at length with the patient today.  The patient concerns regarding his medicines were addressed.  The following changes have been made:  No change  Labs/ tests ordered today include: Calcium scoring CT No orders of the defined types were placed in this encounter.   Recommend 150 minutes/week of aerobic exercise Low fat, low carb, high fiber diet recommended  Disposition:   FU in 1 year   Signed, Lance Muss, MD  11/20/2021 4:25 PM    [facility] [address] [place], [place], [place]  [address] Phone: [phone]; Fax: [phone]

## 2021-11-26 ENCOUNTER — Ambulatory Visit: Payer: Self-pay | Admitting: Orthopaedic Surgery

## 2021-11-27 ENCOUNTER — Ambulatory Visit (HOSPITAL_BASED_OUTPATIENT_CLINIC_OR_DEPARTMENT_OTHER)
Admission: RE | Admit: 2021-11-27 | Discharge: 2021-11-27 | Disposition: A | Discharge status: Home / Self Care | Payer: Medicare (Managed Care) | Source: Ambulatory Visit | Attending: Interventional Cardiology | Admitting: Interventional Cardiology

## 2021-11-27 DIAGNOSIS — I10 Essential (primary) hypertension: Secondary | ICD-10-CM | POA: Insufficient documentation

## 2021-12-01 ENCOUNTER — Telehealth: Payer: Self-pay | Admitting: *Deleted

## 2021-12-01 NOTE — Telephone Encounter (Signed)
-----   Message from Corky Crafts, MD sent at 11/23/2021  8:00 AM EDT ----- OK to inform patient he is not a candidate for weight loss med ----- Message ----- From: Awilda Metro, RPH-CPP Sent: 11/23/2021   7:38 AM EDT To: Corky Crafts, MD; #  His insurance won't cover it unfortunately - he has a Medicare plan which excludes coverage of weight loss meds. Since he doesn't have DM, other GLPs wouldn't be covered either. ----- Message ----- From: Corky Crafts, MD Sent: 11/20/2021   5:26 PM EDT To: Dossie Arbour, RN; #  Would he be a candidate for wegovy?  If you do need to get bloodwork or wegovy, he will also need fasting lipid profile. Pat, if not obtained by PharmD, would try to get when he is fasting.

## 2021-12-02 DIAGNOSIS — Z0289 Encounter for other administrative examinations: Secondary | ICD-10-CM

## 2021-12-04 NOTE — Telephone Encounter (Signed)
Corky Crafts, MD  11/30/2021  9:46 PM EDT     Calcium score 0.  No significant extracardiac findings either

## 2021-12-04 NOTE — Telephone Encounter (Signed)
Call placed to patient by interpreter [phone]) to  Review weight loss med information. Review Calcium score CT results Schedule fasting lipid profile. Interpreter was not able to reach patient.  Phone would ring and then call would be disconnected with message to try again later.

## 2021-12-09 ENCOUNTER — Ambulatory Visit (INDEPENDENT_AMBULATORY_CARE_PROVIDER_SITE_OTHER): Payer: Medicare (Managed Care) | Admitting: Family Medicine

## 2021-12-09 ENCOUNTER — Encounter (INDEPENDENT_AMBULATORY_CARE_PROVIDER_SITE_OTHER): Payer: Self-pay | Admitting: Family Medicine

## 2021-12-09 VITALS — BP 121/81 | HR 77 | Temp 98.1°F | Ht 69.0 in | Wt 292.0 lb

## 2021-12-09 DIAGNOSIS — Z7901 Long term (current) use of anticoagulants: Secondary | ICD-10-CM | POA: Insufficient documentation

## 2021-12-09 DIAGNOSIS — I10 Essential (primary) hypertension: Secondary | ICD-10-CM | POA: Diagnosis not present

## 2021-12-09 DIAGNOSIS — Z1331 Encounter for screening for depression: Secondary | ICD-10-CM

## 2021-12-09 DIAGNOSIS — G4733 Obstructive sleep apnea (adult) (pediatric): Secondary | ICD-10-CM

## 2021-12-09 DIAGNOSIS — R5383 Other fatigue: Secondary | ICD-10-CM

## 2021-12-09 DIAGNOSIS — I4891 Unspecified atrial fibrillation: Secondary | ICD-10-CM | POA: Insufficient documentation

## 2021-12-09 DIAGNOSIS — E559 Vitamin D deficiency, unspecified: Secondary | ICD-10-CM | POA: Insufficient documentation

## 2021-12-09 DIAGNOSIS — R0602 Shortness of breath: Secondary | ICD-10-CM | POA: Diagnosis not present

## 2021-12-09 DIAGNOSIS — E669 Obesity, unspecified: Secondary | ICD-10-CM

## 2021-12-09 DIAGNOSIS — Z6841 Body Mass Index (BMI) 40.0 and over, adult: Secondary | ICD-10-CM

## 2021-12-09 NOTE — Telephone Encounter (Signed)
Called patient. He stated he would need an interpreter. Mikle Bosworth our CMA, certified interpreter for our office called patient back to let him know his CT results. We also informed patient that he does not currently qualify for any weight loss medications like, Wegovy. Informed patient that Dr. Eldridge Dace did want fasting lipid panel done. Patient informed us that he had lab work today, and the lipid panel was included. Informed patient that we would make sure Dr. Eldridge Dace sees results.

## 2021-12-10 LAB — COMPREHENSIVE METABOLIC PANEL
ALT: 14 IU/L (ref 0–44)
AST: 21 IU/L (ref 0–40)
Albumin/Globulin Ratio: 1.5 (ref 1.2–2.2)
Albumin: 4.3 g/dL (ref 3.9–4.9)
Alkaline Phosphatase: 72 IU/L (ref 44–121)
BUN/Creatinine Ratio: 15 (ref 10–24)
BUN: 16 mg/dL (ref 8–27)
Bilirubin Total: 0.5 mg/dL (ref 0.0–1.2)
CO2: 24 mmol/L (ref 20–29)
Calcium: 9.3 mg/dL (ref 8.6–10.2)
Chloride: 102 mmol/L (ref 96–106)
Creatinine, Ser: 1.07 mg/dL (ref 0.76–1.27)
Globulin, Total: 2.8 g/dL (ref 1.5–4.5)
Glucose: 91 mg/dL (ref 70–99)
Potassium: 5 mmol/L (ref 3.5–5.2)
Sodium: 141 mmol/L (ref 134–144)
Total Protein: 7.1 g/dL (ref 6.0–8.5)
eGFR: 77 mL/min/{1.73_m2} (ref >59)

## 2021-12-10 LAB — LIPID PANEL
Chol/HDL Ratio: 4.4 ratio (ref 0.0–5.0)
Cholesterol, Total: 146 mg/dL (ref 100–199)
HDL: 33 mg/dL — ABNORMAL LOW (ref >39)
LDL Chol Calc (NIH): 81 mg/dL (ref 0–99)
Triglycerides: 188 mg/dL — ABNORMAL HIGH (ref 0–149)
VLDL Cholesterol Cal: 32 mg/dL (ref 5–40)

## 2021-12-10 LAB — HEMOGLOBIN A1C
Est. average glucose Bld gHb Est-mCnc: 131 mg/dL
Hgb A1c MFr Bld: 6.2 % — ABNORMAL HIGH (ref 4.8–5.6)

## 2021-12-10 LAB — VITAMIN B12: Vitamin B-12: 585 pg/mL (ref 232–1245)

## 2021-12-10 LAB — VITAMIN D 25 HYDROXY (VIT D DEFICIENCY, FRACTURES): Vit D, 25-Hydroxy: 21.4 ng/mL — ABNORMAL LOW (ref 30.0–100.0)

## 2021-12-10 LAB — INSULIN, RANDOM: INSULIN: 26.2 u[IU]/mL — ABNORMAL HIGH (ref 2.6–24.9)

## 2021-12-10 LAB — TSH: TSH: 1.38 u[IU]/mL (ref 0.450–4.500)

## 2021-12-10 LAB — T4, FREE: Free T4: 1.02 ng/dL (ref 0.82–1.77)

## 2021-12-11 NOTE — Telephone Encounter (Signed)
LDL well controlled

## 2021-12-15 NOTE — Telephone Encounter (Signed)
Patient notified by interpreter (# [phone])

## 2021-12-17 NOTE — Progress Notes (Signed)
Call to patient, message given from Dr Cathey Endow.  Pt verbalized understanding.  Call ended.

## 2021-12-22 ENCOUNTER — Other Ambulatory Visit (HOSPITAL_COMMUNITY): Payer: Self-pay | Admitting: Physician Assistant

## 2021-12-23 ENCOUNTER — Ambulatory Visit (INDEPENDENT_AMBULATORY_CARE_PROVIDER_SITE_OTHER): Payer: Self-pay | Admitting: Family Medicine

## 2021-12-23 NOTE — Progress Notes (Signed)
Chief Complaint:   OBESITY Daryl [name] (MR# [id number]) is a 66 y.o. male who presents for evaluation and treatment of obesity and related comorbidities. Current BMI is Body mass index is 43.12 kg/[name]. Daryl [name] has been struggling with his weight for many years and has been unsuccessful in either losing weight, maintaining weight loss, or reaching his healthy weight goal.  Daryl [name] started to gain weight around middle school, athletic in high school, and maintained weight in his 62s.  Moved to [place] in 1996.  Gained weight when getting married after having son.  Denies big portions.  Has a family history of obesity.  His wife is supportive.  Would like to lose 60 pounds.  Has a sedentary job.  Eats fruit for morning snack, sugar and coffee in the morning.  Eats leftovers or fruit in the afternoon.  Denies mindless snacking.  Eats a lot of rice.  Eats chicken, fish, and a lot of cheese.  Daryl [name] is currently in the action stage of change and ready to dedicate time achieving and maintaining a healthier weight. Daryl [name] is interested in becoming our patient and working on intensive lifestyle modifications including (but not limited to) diet and exercise for weight loss.  Daryl [name] habits were reviewed today and are as follows: His family eats meals together, he thinks his family will eat healthier with him, his desired weight loss is 72 lbs, he has been heavy most of his life, he started gaining weight approximately 7 years ago, his heaviest weight ever was 292.8 pounds, he has significant food cravings issues, and he is frequently drinking liquids with calories.  Depression Screen Daryl [name]'s Food and Mood (modified PHQ-9) score was 11.     12/09/2021    9:26 AM  Depression screen PHQ 2/9  Decreased Interest 3  Down, Depressed, Hopeless 0  PHQ - 2 Score 3  Altered sleeping 2  Tired, decreased energy 3  Change in appetite 0  Feeling bad or failure about yourself  1  Trouble concentrating  0  Moving slowly or fidgety/restless 2  Suicidal thoughts 0  PHQ-9 Score 11  Difficult doing work/chores Very difficult   Subjective:   1. Other fatigue Daryl [name] admits to daytime somnolence and admits to waking up still tired. Patient has a history of symptoms of daytime fatigue and morning fatigue. Daryl [name] generally gets 6 hours of sleep per night, and states that he has nightime awakenings. Snoring is present. Apneic episodes are present. Epworth Sleepiness Score is 17.   2. SOBOE (shortness of breath on exertion) Daryl [name] notes increasing shortness of breath with exercising and seems to be worsening over time with weight gain. He notes getting out of breath sooner with activity than he used to. This has not gotten worse recently. Daryl [name] denies shortness of breath at rest or orthopnea.  3. OSA (obstructive sleep apnea),Severe Daryl [name] started CPAP 1 month ago, and he is sleeping better.  4. Atrial fibrillation, unspecified type [facility]) Daryl [name] is on Eliquis 5 mg twice daily, Flecainide 100 mg twice daily, and metoprolol 25 mg twice daily per Dr. Eldridge Dace  5. Essential hypertension Daryl [name]'s blood pressure is well controlled on hydrochlorothiazide 12.5 mg daily and Toprol XL 25-50 mg every morning and 25 mg nightly per Cardiology.  6. Vitamin D deficiency Daryl [name] complains of fatigue and he has been off vitamin D supplementation.  Assessment/Plan:   1. Other fatigue Daryl [name] does feel that his weight is causing his energy to be lower than it should  be. Fatigue may be related to obesity, depression or many other causes. Labs will be ordered, and in the meanwhile, Kabe will focus on self care including making healthy food choices, increasing physical activity and focusing on stress reduction.  - TSH - T4, free - Lipid panel - Insulin, random - Hemoglobin A1c - Comprehensive metabolic panel - Vitamin B12  2. SOBOE (shortness of breath on exertion) Daryl [name] does feel that he gets out of  breath more easily that he used to when he exercises. Daryl [name]'s shortness of breath appears to be obesity related and exercise induced. He has agreed to work on weight loss and gradually increase exercise to treat his exercise induced shortness of breath. Will continue to monitor closely.  3. OSA (obstructive sleep apnea),Severe Daryl [name] will continue with his CPAP compliance nightly.  4. Atrial fibrillation, unspecified type Daryl [name]) Daryl [name] is to avoid the use of phentermine.  5. Essential hypertension Daryl [name] will continue his current blood pressure medications, and look for blood pressure improvements with weight loss.  6. Vitamin D deficiency We will check labs today, and we will follow-up at Daryl [name]'s next visit.  - VITAMIN D 25 Hydroxy (Vit-D Deficiency, Fractures)  7. Depression screening Daryl [name] had a positive depression screening. Depression is commonly associated with obesity and often results in emotional eating behaviors. We will monitor this closely and work on CBT to help improve the non-hunger eating patterns. Referral to Psychology may be required if no improvement is seen as he continues in our clinic.  8. Obesity,Current BMI 43.2 Daryl [name] is currently in the action stage of change and his goal is to continue with weight loss efforts. I recommend Daryl [name] begin the structured treatment plan as follows:  He has agreed to the Category 3 Plan.  Exercise goals: As is.  Behavioral modification strategies: increasing lean protein intake, decreasing simple carbohydrates, increasing vegetables, increasing water intake, increasing high fiber foods, no skipping meals, keeping healthy foods in the home, and decreasing junk food.  He was informed of the importance of frequent follow-up visits to maximize his success with intensive lifestyle modifications for his multiple health conditions. He was informed we would discuss his lab results at his next visit unless there is a critical issue that  needs to be addressed sooner. Daryl [name] agreed to keep his next visit at the agreed upon time to discuss these results.  Objective:   Blood pressure 121/81, pulse 77, temperature 98.1 F (36.7 C), height 5\' 9"  (1.753 [name]), weight 292 lb (132.5 kg), SpO2 95 %. Body mass index is 43.12 kg/[name].  EKG: Normal sinus rhythm, rate 68 BPM.  Indirect Calorimeter completed today shows a VO2 of 297 and a REE of 2045.  His calculated basal metabolic rate is 2046 thus his basal metabolic rate is worse than expected.  General: Cooperative, alert, well developed, in no acute distress. HEENT: Conjunctivae and lids unremarkable. Cardiovascular: Regular rhythm.  Lungs: Normal work of breathing. Neurologic: No focal deficits.   Lab Results  Component Value Date   CREATININE 1.07 12/09/2021   BUN 16 12/09/2021   NA 141 12/09/2021   K 5.0 12/09/2021   CL 102 12/09/2021   CO2 24 12/09/2021   Lab Results  Component Value Date   ALT 14 12/09/2021   AST 21 12/09/2021   ALKPHOS 72 12/09/2021   BILITOT 0.5 12/09/2021   Lab Results  Component Value Date   HGBA1C 6.2 (H) 12/09/2021   Lab Results  Component Value Date   INSULIN 26.2 (H) 12/09/2021  Lab Results  Component Value Date   TSH 1.380 12/09/2021   Lab Results  Component Value Date   CHOL 146 12/09/2021   HDL 33 (L) 12/09/2021   LDLCALC 81 12/09/2021   TRIG 188 (H) 12/09/2021   CHOLHDL 4.4 12/09/2021   Lab Results  Component Value Date   WBC 7.5 07/28/2021   HGB 15.1 07/28/2021   HCT 46.9 07/28/2021   MCV 89.3 07/28/2021   PLT 249 07/28/2021   No results found for: "IRON", "TIBC", "FERRITIN" Obesity Behavioral Intervention:   Approximately 15 minutes were spent on the discussion below.  ASK: We discussed the diagnosis of obesity with Daryl [name] today and Herron agreed to give [place] permission to discuss obesity behavioral modification therapy today.  ASSESS: Daryl [name] has the diagnosis of obesity and his BMI today is 43.2. Daryl [name] is  in the action stage of change.   ADVISE: Daryl [name] was educated on the multiple health risks of obesity as well as the benefit of weight loss to improve his health. He was advised of the need for long term treatment and the importance of lifestyle modifications to improve his current health and to decrease his risk of future health problems.  AGREE: Multiple dietary modification options and treatment options were discussed and Bishop agreed to follow the recommendations documented in the above note.  ARRANGE: Kionte was educated on the importance of frequent visits to treat obesity as outlined per CMS and USPSTF guidelines and agreed to schedule his next follow up appointment today.  Attestation Statements:   Reviewed by clinician on day of visit: allergies, medications, problem list, medical history, surgical history, family history, social history, and previous encounter notes.   Trude Mcburney, am acting as transcriptionist for Seymour Bars, DO.  I have reviewed the above documentation for accuracy and completeness, and I agree with the above. Glennis Brink, DO  Spanish interpreter present for duration of visit.

## 2022-02-08 ENCOUNTER — Ambulatory Visit (INDEPENDENT_AMBULATORY_CARE_PROVIDER_SITE_OTHER): Payer: Medicare (Managed Care) | Admitting: Physician Assistant

## 2022-02-08 ENCOUNTER — Ambulatory Visit (INDEPENDENT_AMBULATORY_CARE_PROVIDER_SITE_OTHER): Payer: Medicare (Managed Care)

## 2022-02-08 ENCOUNTER — Encounter: Payer: Self-pay | Admitting: Physician Assistant

## 2022-02-08 VITALS — Ht 69.0 in | Wt 293.2 lb

## 2022-02-08 DIAGNOSIS — M1712 Unilateral primary osteoarthritis, left knee: Secondary | ICD-10-CM

## 2022-02-08 MED ORDER — METHYLPREDNISOLONE ACETATE 40 MG/ML IJ SUSP
40.0000 mg | INTRAMUSCULAR | Status: AC | PRN
  Administered 2022-02-08: 40 mg via INTRA_ARTICULAR

## 2022-02-08 MED ORDER — LIDOCAINE HCL 1 % IJ SOLN
3.0000 mL | INTRAMUSCULAR | Status: AC | PRN
  Administered 2022-02-08: 3 mL

## 2022-02-08 NOTE — Progress Notes (Signed)
   Procedure Note  Patient: Daryl [name]             Date of Birth: Jun 04, 1955           MRN: [id number]             Visit Date: 02/08/2022 HPI: Daryl [name] returns today for follow-up of his right knee pain.  He states he has not lost weight and therefore did not return for weight check.  He has known osteoarthritis both knees.  He states that his left knee pain became worse yesterday when he had a stabbing pain medial aspect the knee.  He has had no falls or injuries.  He states he just twisted needed a little bit.  He has tried heat and ice without any real relief.  He is using a cane now.  Review of systems: Negative for fevers or chills. Nondiabetic.  Physical exam: Weight 293 pounds height 5 foot 9 inches BMI 43.3 General no acute distress. Psych: Alert and oriented x3 Left knee: Tenderness medial joint line.  No abnormal warmth erythema.  Overall good range of motion with crepitus.  Procedures: Visit Diagnoses:  1. Unilateral primary osteoarthritis, left knee     Large Joint Inj: L knee on 02/08/2022 4:38 PM Indications: pain Details: 22 G 1.5 in needle, anterolateral approach  Arthrogram: No  Medications: 3 mL lidocaine 1 %; 40 mg methylPREDNISolone acetate 40 MG/ML Outcome: tolerated well, no immediate complications Procedure, treatment alternatives, risks and benefits explained, specific risks discussed. Consent was given by the patient. Immediately prior to procedure a time out was called to verify the correct patient, procedure, equipment, support staff and site/side marked as required. Patient was prepped and draped in the usual sterile fashion.    Radiology:Left knee 3 views: No acute fracture.  Varus malalignment with bone-on-bone medial compartment.  Moderate to severe patellofemoral arthritis.  Periarticular spurs off the lateral compartment joint line lateral compartment overall well-preserved.  Normal bone density.   Plan: Having continue to work on weight loss.   We will see him back in 3 months see what type of progress he has made in regards to weight loss.  The patient does speak fair English however an interpreter was used due to the fact that [place] is his primary language.  We will also try a knee brace to see if this will give him some stability due to the fact that he is having a hard time bearing weight on the knee since a twisting motion yesterday.  Questions were encouraged and answered

## 2022-02-09 ENCOUNTER — Telehealth: Payer: Self-pay | Admitting: Physician Assistant

## 2022-02-09 NOTE — Telephone Encounter (Signed)
Number on chart isn't working. Sent patient a Therapist, music

## 2022-02-09 NOTE — Telephone Encounter (Signed)
Is this ok?

## 2022-02-09 NOTE — Telephone Encounter (Signed)
Pt called in requesting for handicap placard... Pt stated that it is hard for him to walk long distance.... Pt was wondering if the doctor can fill out the handicap placard paperwork as soon as possible. Pt requesting callback.Marland KitchenMarland KitchenMarland Kitchen

## 2022-02-24 ENCOUNTER — Ambulatory Visit (INDEPENDENT_AMBULATORY_CARE_PROVIDER_SITE_OTHER): Payer: Medicare (Managed Care) | Admitting: Internal Medicine

## 2022-02-24 ENCOUNTER — Encounter (INDEPENDENT_AMBULATORY_CARE_PROVIDER_SITE_OTHER): Payer: Self-pay | Admitting: Internal Medicine

## 2022-02-24 VITALS — BP 112/68 | HR 69 | Temp 97.7°F | Ht 69.0 in | Wt 281.0 lb

## 2022-02-24 DIAGNOSIS — Z6841 Body Mass Index (BMI) 40.0 and over, adult: Secondary | ICD-10-CM | POA: Diagnosis not present

## 2022-02-24 DIAGNOSIS — I10 Essential (primary) hypertension: Secondary | ICD-10-CM | POA: Diagnosis not present

## 2022-02-24 DIAGNOSIS — E669 Obesity, unspecified: Secondary | ICD-10-CM | POA: Diagnosis not present

## 2022-03-13 NOTE — Progress Notes (Unsigned)
Chief Complaint:   OBESITY Daryl [name] is here to discuss his progress with his obesity treatment plan along with follow-up of his obesity related diagnoses. Daryl [name] is on the Category 3 Plan and states he is following his eating plan approximately 40% of the time. Daryl [name] states he is doing a little walking.  Today's visit was #: 2 Starting weight: 292 lbs Starting date: 12/09/2021 Today's weight: 281 lbs Today's date: 02/24/2022 Total lbs lost to date: 11 Total lbs lost since last in-office visit: 11  Interim History: This is my first encounter with patient. Pt was scheduled for a 20 minute visit. He is Spanish speaking and has not been following nutritional plan. He also self-prescribed Rybelsus and was requesting medication at the end of appointment. Pt got it from a friend.  Subjective:   1. Essential hypertension BP at goal. Pt is on metoprolol, HCTZ. Recent renal parameters within normal limits.  Assessment/Plan:   1. Essential hypertension Continue weight loss therapy and medications. Maintain adequate hydration.  2. Obesity,Current BMI 41.6 Daryl [name] is currently in the action stage of change. As such, his goal is to continue with weight loss efforts. He has agreed to the Category 3 Plan.   I reviewed meal plan, which was translated to [place]. I also counseled pt on our approach, which includes nutritional and behavioral changes and that pharmacotherapy is adjunct. Pt will be scheduled for a 40 minute follow-up if desired to address nutritional concerns as well as patient's request for pharmacotherapy and self prescribing behavior.  Exercise goals:  As is  Behavioral modification strategies: increasing lean protein intake, decreasing simple carbohydrates, meal planning and cooking strategies, and planning for success.  Daryl [name] has agreed to follow-up with our clinic in 2-3 weeks. He was informed of the importance of frequent follow-up visits to maximize his success with  intensive lifestyle modifications for his multiple health conditions.   Objective:   Blood pressure 112/68, pulse 69, temperature 97.7 F (36.5 C), height 5\' 9"  (1.753 m), weight 281 lb (127.5 kg), SpO2 93 %. Body mass index is 41.5 kg/m.  General: Cooperative, alert, well developed, in no acute distress. HEENT: Conjunctivae and lids unremarkable. Cardiovascular: Regular rhythm.  Lungs: Normal work of breathing. Neurologic: No focal deficits.   Lab Results  Component Value Date   CREATININE 1.07 12/09/2021   BUN 16 12/09/2021   NA 141 12/09/2021   K 5.0 12/09/2021   CL 102 12/09/2021   CO2 24 12/09/2021   Lab Results  Component Value Date   ALT 14 12/09/2021   AST 21 12/09/2021   ALKPHOS 72 12/09/2021   BILITOT 0.5 12/09/2021   Lab Results  Component Value Date   HGBA1C 6.2 (H) 12/09/2021   Lab Results  Component Value Date   INSULIN 26.2 (H) 12/09/2021   Lab Results  Component Value Date   TSH 1.380 12/09/2021   Lab Results  Component Value Date   CHOL 146 12/09/2021   HDL 33 (L) 12/09/2021   LDLCALC 81 12/09/2021   TRIG 188 (H) 12/09/2021   CHOLHDL 4.4 12/09/2021   Lab Results  Component Value Date   VD25OH 21.4 (L) 12/09/2021   Lab Results  Component Value Date   WBC 7.5 07/28/2021   HGB 15.1 07/28/2021   HCT 46.9 07/28/2021   MCV 89.3 07/28/2021   PLT 249 07/28/2021   Attestation Statements:   Reviewed by clinician on day of visit: allergies, medications, problem list, medical history, surgical history, family history, social  history, and previous encounter notes.  Time spent on visit including pre-visit chart review and post-visit care and charting was 20 minutes.   I, Kyung Rudd, BS, CMA, am acting as transcriptionist for Worthy Rancher, MD.  I have reviewed the above documentation for accuracy and completeness, and I agree with the above. -Worthy Rancher, MD

## 2022-03-18 ENCOUNTER — Ambulatory Visit (INDEPENDENT_AMBULATORY_CARE_PROVIDER_SITE_OTHER): Payer: Medicare (Managed Care) | Admitting: Internal Medicine

## 2022-04-06 ENCOUNTER — Other Ambulatory Visit (HOSPITAL_COMMUNITY): Payer: Self-pay | Admitting: Physician Assistant

## 2022-04-19 ENCOUNTER — Ambulatory Visit (HOSPITAL_COMMUNITY): Payer: Medicare (Managed Care) | Admitting: Physician Assistant

## 2022-04-29 ENCOUNTER — Other Ambulatory Visit (HOSPITAL_COMMUNITY): Payer: Self-pay | Admitting: *Deleted

## 2022-04-29 MED ORDER — FLECAINIDE ACETATE 100 MG PO TABS: 100.0000 mg | ORAL_TABLET | Freq: Two times a day (BID) | ORAL | 0 refills | Status: DC

## 2022-05-03 ENCOUNTER — Other Ambulatory Visit (HOSPITAL_COMMUNITY): Payer: Self-pay | Admitting: *Deleted

## 2022-05-03 MED ORDER — METOPROLOL SUCCINATE ER 25 MG PO TB24
ORAL_TABLET | ORAL | 2 refills | Status: DC
Start: 2022-05-03 — End: 2022-10-04

## 2022-05-13 ENCOUNTER — Ambulatory Visit: Payer: Medicare (Managed Care) | Admitting: Physician Assistant

## 2022-06-10 ENCOUNTER — Encounter (HOSPITAL_COMMUNITY): Payer: Self-pay | Admitting: Physician Assistant

## 2022-06-10 ENCOUNTER — Ambulatory Visit (HOSPITAL_COMMUNITY)
Admission: RE | Admit: 2022-06-10 | Discharge: 2022-06-10 | Disposition: A | Discharge status: Home / Self Care | Payer: Medicare (Managed Care) | Source: Ambulatory Visit | Attending: Physician Assistant | Admitting: Physician Assistant

## 2022-06-10 VITALS — BP 128/84 | HR 70 | Ht 69.0 in | Wt 279.6 lb

## 2022-06-10 DIAGNOSIS — Z6841 Body Mass Index (BMI) 40.0 and over, adult: Secondary | ICD-10-CM | POA: Insufficient documentation

## 2022-06-10 DIAGNOSIS — Z7901 Long term (current) use of anticoagulants: Secondary | ICD-10-CM | POA: Diagnosis not present

## 2022-06-10 DIAGNOSIS — D6869 Other thrombophilia: Secondary | ICD-10-CM | POA: Insufficient documentation

## 2022-06-10 DIAGNOSIS — I4819 Other persistent atrial fibrillation: Secondary | ICD-10-CM | POA: Insufficient documentation

## 2022-06-10 DIAGNOSIS — G4733 Obstructive sleep apnea (adult) (pediatric): Secondary | ICD-10-CM | POA: Diagnosis not present

## 2022-06-10 DIAGNOSIS — I10 Essential (primary) hypertension: Secondary | ICD-10-CM | POA: Insufficient documentation

## 2022-06-10 DIAGNOSIS — E669 Obesity, unspecified: Secondary | ICD-10-CM | POA: Insufficient documentation

## 2022-06-10 DIAGNOSIS — Z8249 Family history of ischemic heart disease and other diseases of the circulatory system: Secondary | ICD-10-CM | POA: Insufficient documentation

## 2022-06-10 DIAGNOSIS — Z79899 Other long term (current) drug therapy: Secondary | ICD-10-CM | POA: Diagnosis not present

## 2022-06-10 MED ORDER — RIVAROXABAN 20 MG PO TABS: 20.0000 mg | ORAL_TABLET | Freq: Every day | ORAL | 11 refills | Status: DC

## 2022-06-10 NOTE — Progress Notes (Signed)
Primary Care Physician: Pcp, No Primary Cardiologist: Dr Irish Lack  Primary Electrophysiologist: none Referring Physician: [facility]   Daryl [name] is a 67 y.o. male with a history of HTN, OSA, and atrial fibrillation who presents for follow up in the [facility].  The patient was initially diagnosed with atrial fibrillation 06/24/20 after presenting to the ED with symptoms of chest presure. ECG showed afib with RVR. He underwent DCCV at that time and his symptoms resolved. Patient was started on Eliquis for a CHADS2VASC score of 2. Patient does have a diagnosis of severe OSA but has not yet had CPAP titration. His echo showed EF 50%, mild MR. Patient is s/p repeat DCCV on 07/14/20 after starting flecainide.   On follow up today, patient reports that he has done well since his last visit from a cardiac standpoint. He is seeing a nutritionist at [facility]. He is using his CPAP machine but would like to change his mask shape. He also admits that he has only been taking his Eliquis once daily to avoid bruising.   Today, he denies symptoms of palpitations, chest pain, shortness of breath, orthopnea, PND, lower extremity edema, dizziness, presyncope, syncope, bleeding, or neurologic sequela. The patient is tolerating medications without difficulties and is otherwise without complaint today.    Atrial Fibrillation Risk Factors:  he does have symptoms or diagnosis of sleep apnea. he is compliant with CPAP therapy.  he does not have a history of rheumatic fever. he does not have a history of alcohol use. The patient does not have a history of early familial atrial fibrillation or other arrhythmias.  he has a BMI of Body mass index is 41.29 kg/m.Marland Kitchen Filed Weights   06/10/22 1539  Weight: 126.8 kg    Family History  Problem Relation Age of Onset   Hypertension Mother    Obesity Mother    AAA (abdominal aortic aneurysm) Mother    Heart disease Mother     AAA (abdominal aortic aneurysm) Brother      Atrial Fibrillation Management history:  Previous antiarrhythmic drugs: flecainide  Previous cardioversions: 06/24/20, 07/14/20 Previous ablations: none CHADS2VASC score: 2 Anticoagulation history: Eliquis   Past Medical History:  Diagnosis Date   A-fib ([place])    Heartburn    HTN (hypertension)    OSA (obstructive sleep apnea)    Osteoarthritis    knee   Overweight    Sleep apnea    SOBOE (shortness of breath on exertion)    Past Surgical History:  Procedure Laterality Date   CARDIOVERSION N/A 07/14/2020   Procedure: CARDIOVERSION;  Surgeon: Werner Lean, MD;  Location: [facility];  Service: Cardiovascular;  Laterality: N/A;    Current Outpatient Medications  Medication Sig Dispense Refill   acetaminophen (TYLENOL) 500 MG tablet Take 500 mg by mouth every 6 (six) hours as needed for mild pain or moderate pain.     flecainide (TAMBOCOR) 100 MG tablet Take 1 tablet (100 mg total) by mouth 2 (two) times daily. 180 tablet 0   hydrochlorothiazide (MICROZIDE) 12.5 MG capsule TAKE 1 CAPSULE BY MOUTH EVERY DAY 90 capsule 3   lisinopril (ZESTRIL) 20 MG tablet Take 20 mg by mouth daily.     metoprolol succinate (TOPROL-XL) 25 MG 24 hr tablet TAKE 1 TABLET BY MOUTH EVERY MORNING AND 1 TABLET  AT BEDTIME 180 tablet 2   NEOMYCIN-POLYMYXIN-HYDROCORTISONE (CORTISPORIN) 1 % SOLN OTIC solution Place 1 drop into both ears daily as needed (itching).  10 mL 3   rivaroxaban (XARELTO) 20 MG TABS tablet Take 1 tablet (20 mg total) by mouth daily with supper. 30 tablet 11   No current facility-administered medications for this encounter.    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[employer], technician  Tobacco Use   Smoking status: Never   Smokeless tobacco: Never   Tobacco comments:    Never smoke 06/29/21  Vaping Use   Vaping Use: Never used  Substance and Sexual Activity   Alcohol use: Yes    Alcohol/week: 1.0 - 2.0 standard drink of alcohol    Types: 1 - 2 Cans of beer per week    Comment: 1 beer socially 06/29/21   Drug use: Never   Sexual activity: Not on file  Other Topics Concern   Not on file  Social History Narrative   Not on file   Social Determinants of Health   Financial Resource Strain: Not on file  Food Insecurity: Not on file  Transportation Needs: Not on file  Physical Activity: Not on file  Stress: Not on file  Social Connections: Not on file  Intimate Partner Violence: Not on file     ROS- All systems are reviewed and negative except as per the HPI above.  Physical Exam: Vitals:   06/10/22 1539  BP: 128/84  Pulse: 70  Weight: 126.8 kg  Height: '5\' 9"'$  (1.753 m)    GEN- The patient is a well appearing obese male, alert and oriented x 3 today.   HEENT-head normocephalic, atraumatic, sclera clear, conjunctiva pink, hearing intact, trachea midline. Lungs- Clear to ausculation bilaterally, normal work of breathing Heart- Regular rate and rhythm, no murmurs, rubs or gallops  GI- soft, NT, ND, + BS Extremities- no clubbing, cyanosis, or edema MS- no significant deformity or atrophy Skin- no rash or lesion Psych- euthymic mood, full affect Neuro- strength and sensation are intact   Wt Readings from Last 3 Encounters:  06/10/22 126.8 kg  02/24/22 127.5 kg  02/08/22 133 kg    EKG today demonstrates  SR Vent. rate 70 BPM PR interval 188 ms QRS duration 114 ms QT/QTcB 384/414 ms  Echo 07/10/20 demonstrated 1. Left ventricular ejection fraction, by estimation, is 50%. The left  ventricle has low normal function. The left ventricle has no regional wall motion abnormalities. Left ventricular diastolic parameters are   indeterminate.   2. Right ventricular systolic function is mildly reduced. The right  ventricular size is normal.   3. The mitral valve is normal in structure. Mild mitral valve  regurgitation.   4. The aortic valve is tricuspid. Aortic valve regurgitation is trivial.  Mild aortic valve sclerosis is present, with no evidence of aortic valve  stenosis.   5. The inferior vena cava is normal in size with greater than 50%  respiratory variability, suggesting right atrial pressure of 3 mmHg.   Epic records are reviewed at length today  CHA2DS2-VASc Score = 2  The patient's score is based upon: CHF History: 0 HTN History: 1 Diabetes History: 0 Stroke History: 0 Vascular Disease History: 0 Age Score: 1 Gender Score: 0       ASSESSMENT AND PLAN: 1. Persistent Atrial Fibrillation (  ICD10:  I48.19) The patient's CHA2DS2-VASc score is 2, indicating a 2.2% annual risk of stroke.   Patient appears to be maintaining SR.  Continue Toprol 50 mg AM and 25 mg PM Continue flecainide 100 mg BID We discussed importance of taking anticoagulation for stroke prevention. Will change to Xarelto 20 mg daily for ease of dosing.   2. Secondary Hypercoagulable State (ICD10:  D68.69) The patient is at significant risk for stroke/thromboembolism based upon his CHA2DS2-VASc Score of 2.  Start Rivaroxaban (Xarelto).    3. Obesity Body mass index is 41.29 kg/m. Lifestyle modification was discussed and encouraged including regular physical activity and weight reduction. Followed by [facility] nutrition.   4. Obstructive sleep apnea Diagnosed with severe OSA 11/05/19 Followed by [facility].  Patient is using CPAP nightly, he plans to reach out to [facility] to discuss changing his mask.   5. HTN Stable, no changes today.   Follow up in the AF clinic in 6 months.    [facility] [address] [place], [place]  [address] [phone] 06/10/2022 4:29 PM

## 2022-06-10 NOTE — Patient Instructions (Signed)
Stop Eliquis   Start Xarleto '20mg'$  take with biggest meal of day  Next schedule dose of Eliquis

## 2022-06-14 ENCOUNTER — Ambulatory Visit: Payer: Medicare (Managed Care) | Admitting: Physician Assistant

## 2022-06-17 ENCOUNTER — Encounter: Payer: Self-pay | Admitting: Radiology

## 2022-06-22 ENCOUNTER — Encounter: Payer: Self-pay | Admitting: Physician Assistant

## 2022-06-22 ENCOUNTER — Ambulatory Visit (INDEPENDENT_AMBULATORY_CARE_PROVIDER_SITE_OTHER): Payer: Medicare (Managed Care) | Admitting: Physician Assistant

## 2022-06-22 VITALS — Ht 70.0 in | Wt 280.0 lb

## 2022-06-22 DIAGNOSIS — M1712 Unilateral primary osteoarthritis, left knee: Secondary | ICD-10-CM

## 2022-06-22 DIAGNOSIS — M1711 Unilateral primary osteoarthritis, right knee: Secondary | ICD-10-CM | POA: Diagnosis not present

## 2022-06-22 NOTE — Progress Notes (Signed)
HPI: Mr. Reichenberger returns today for follow-up bilateral knee pain.  States his right knee pain is worse than his left.  He has bone-on-bone arthritis medial compartment of both knees.  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  He comes in today with an interpreter to discuss knee replacement.  He states last injection of the left knee on 02/08/2022 gave him no real relief.  He is also asking for handicap permit.  He is unable to take NSAIDs due to the fact that he is on Xarelto due to atrial fibrillation.  He is prediabetic.  Denies any fevers chills shortness of breath or chest pain.  Review of systems see HPI otherwise negative  Physical exam: Height 5 foot 10 weight 280 pounds BMI 40.18 kg/m General: Well-developed well-nourished male no acute distress mood and affect appropriate. Bilateral knees and is varus malalignment.Left knee 0 to 115 degrees flexion right knee 0 to 105 degrees flexion with pain.  Impression: Right knee osteoarthritis Left knee osteoarthritis.  Plan: Discussed with him using interpreter today that would like for the loose at least 5 more pounds to get him under a BMI of 40.  He also will need cardiac clearance given a note stating this.  Will see him back in April for height and weight check and most likely scheduling for surgery at that point in time late July per his preference.  Questions were encouraged and answered at length.  Encouraged him to work on quad strengthening between now and surgery.

## 2022-08-05 ENCOUNTER — Ambulatory Visit: Payer: Medicare (Managed Care) | Admitting: Physician Assistant

## 2022-09-13 ENCOUNTER — Emergency Department (HOSPITAL_COMMUNITY)
Admission: EM | Admit: 2022-09-13 | Discharge: 2022-09-13 | Disposition: A | Discharge status: Home / Self Care | Payer: Medicare (Managed Care) | Attending: Student | Admitting: Student

## 2022-09-13 ENCOUNTER — Other Ambulatory Visit: Payer: Self-pay

## 2022-09-13 ENCOUNTER — Encounter (HOSPITAL_COMMUNITY): Payer: Self-pay

## 2022-09-13 ENCOUNTER — Emergency Department (HOSPITAL_COMMUNITY): Payer: Medicare (Managed Care)

## 2022-09-13 DIAGNOSIS — I10 Essential (primary) hypertension: Secondary | ICD-10-CM | POA: Insufficient documentation

## 2022-09-13 DIAGNOSIS — Z79899 Other long term (current) drug therapy: Secondary | ICD-10-CM | POA: Diagnosis not present

## 2022-09-13 DIAGNOSIS — R0602 Shortness of breath: Secondary | ICD-10-CM | POA: Diagnosis not present

## 2022-09-13 DIAGNOSIS — R002 Palpitations: Secondary | ICD-10-CM | POA: Insufficient documentation

## 2022-09-13 LAB — COMPREHENSIVE METABOLIC PANEL
ALT: 13 U/L (ref 0–44)
AST: 19 U/L (ref 15–41)
Albumin: 3.9 g/dL (ref 3.5–5.0)
Alkaline Phosphatase: 62 U/L (ref 38–126)
Anion gap: 5 (ref 5–15)
BUN: 17 mg/dL (ref 8–23)
CO2: 24 mmol/L (ref 22–32)
Calcium: 8.4 mg/dL — ABNORMAL LOW (ref 8.9–10.3)
Chloride: 106 mmol/L (ref 98–111)
Creatinine, Ser: 1.14 mg/dL (ref 0.61–1.24)
GFR, Estimated: >60 mL/min (ref >60)
Glucose, Bld: 102 mg/dL — ABNORMAL HIGH (ref 70–99)
Potassium: 4 mmol/L (ref 3.5–5.1)
Sodium: 135 mmol/L (ref 135–145)
Total Bilirubin: 0.4 mg/dL (ref 0.3–1.2)
Total Protein: 7.3 g/dL (ref 6.5–8.1)

## 2022-09-13 LAB — CBC WITH DIFFERENTIAL/PLATELET
Abs Immature Granulocytes: 0.04 10*3/uL (ref 0.00–0.07)
Basophils Absolute: 0.1 10*3/uL (ref 0.0–0.1)
Basophils Relative: 1 %
Eosinophils Absolute: 0.4 10*3/uL (ref 0.0–0.5)
Eosinophils Relative: 6 %
HCT: 37 % — ABNORMAL LOW (ref 39.0–52.0)
Hemoglobin: 11.6 g/dL — ABNORMAL LOW (ref 13.0–17.0)
Immature Granulocytes: 1 %
Lymphocytes Relative: 37 %
Lymphs Abs: 2.8 10*3/uL (ref 0.7–4.0)
MCH: 27.4 pg (ref 26.0–34.0)
MCHC: 31.4 g/dL (ref 30.0–36.0)
MCV: 87.5 fL (ref 80.0–100.0)
Monocytes Absolute: 0.6 10*3/uL (ref 0.1–1.0)
Monocytes Relative: 8 %
Neutro Abs: 3.6 10*3/uL (ref 1.7–7.7)
Neutrophils Relative %: 47 %
Platelets: 283 10*3/uL (ref 150–400)
RBC: 4.23 MIL/uL (ref 4.22–5.81)
RDW: 12.5 % (ref 11.5–15.5)
WBC: 7.5 10*3/uL (ref 4.0–10.5)
nRBC: 0 % (ref 0.0–0.2)

## 2022-09-13 LAB — TROPONIN I (HIGH SENSITIVITY): Troponin I (High Sensitivity): 4 ng/L (ref <18)

## 2022-09-13 NOTE — ED Triage Notes (Signed)
Patient is here for evaluation of feeling like his heart is fluttering. Reports has a history of atrial flutter. Patient reports that he also has been getting very fatigued when walking for the past week. States the fluttering comes and goes and is not always going on. Reports most severe symptoms occurred on Saturday.

## 2022-09-13 NOTE — ED Provider Notes (Signed)
[facility] EMERGENCY DEPARTMENT AT [facility] Provider Note  CSN: [id number] Arrival date & time: 09/13/22 7829  Chief Complaint(s) Irregular Heart Beat  HPI Daryl [name] is a 67 y.o. male with PMH paroxysmal a flutter/A-fib, HTN, OSA, sleep apnea who presents emergency department for evaluation of palpitations and shortness of breath.  He states that over the last 2 to 3 days he has had multiple episodes of fluttering in his chest.  He states the worst episode was 2 days ago where he felt this all day.  While he was feeling flutters in his chest he did have some exertional dyspnea.  Here in the emergency room, patient does not currently feel the palpitations but is worried that he is going back into A-fib/flutter and comes to the emergency room for further evaluation.  He currently denies chest pain, shortness of breath, Donnell pain, nausea, vomiting or other systemic symptoms.   Past Medical History Past Medical History:  Diagnosis Date   A-fib (HCC)    Heartburn    HTN (hypertension)    OSA (obstructive sleep apnea)    Osteoarthritis    knee   Overweight    Sleep apnea    SOBOE (shortness of breath on exertion)    Patient Active Problem List   Diagnosis Date Noted   Other fatigue 12/09/2021   SOBOE (shortness of breath on exertion) 12/09/2021   OSA (obstructive sleep apnea) 12/09/2021   Atrial fibrillation (HCC) 12/09/2021   Hypertension 12/09/2021   Vitamin D deficiency 12/09/2021   Depression screening 12/09/2021   Hypercoagulable state due to persistent atrial fibrillation (HCC) 06/29/2021   Persistent atrial fibrillation (HCC) 07/04/2020   PAF (paroxysmal atrial fibrillation) (HCC) 07/01/2020   Unilateral primary osteoarthritis, left knee 04/11/2019   Unilateral primary osteoarthritis, right knee 04/11/2019   Home Medication(s) Prior to Admission medications   Medication Sig Start Date End Date Taking? Authorizing Provider  acetaminophen (TYLENOL) 500  MG tablet Take 500 mg by mouth every 6 (six) hours as needed for mild pain or moderate pain.    [provider]  flecainide (TAMBOCOR) 100 MG tablet Take 1 tablet (100 mg total) by mouth 2 (two) times daily. 04/29/22   Fenton, Clint R, PA  hydrochlorothiazide (MICROZIDE) 12.5 MG capsule TAKE 1 CAPSULE BY MOUTH EVERY DAY 10/12/21   Fenton, Clint R, PA  lisinopril (ZESTRIL) 20 MG tablet Take 20 mg by mouth daily. 03/25/22   [provider]  metoprolol succinate (TOPROL-XL) 25 MG 24 hr tablet TAKE 1 TABLET BY MOUTH EVERY MORNING AND 1 TABLET  AT BEDTIME 05/03/22   Fenton, Clint R, PA  NEOMYCIN-POLYMYXIN-HYDROCORTISONE (CORTISPORIN) 1 % SOLN OTIC solution Place 1 drop into both ears daily as needed (itching). 11/20/21   Corky Crafts, MD  rivaroxaban (XARELTO) 20 MG TABS tablet Take 1 tablet (20 mg total) by mouth daily with supper. 06/10/22   Fenton, Laural Benes, PA  Past Surgical History Past Surgical History:  Procedure Laterality Date   CARDIOVERSION N/A 07/14/2020   Procedure: CARDIOVERSION;  Surgeon: Christell Constant, MD;  Location: [facility];  Service: Cardiovascular;  Laterality: N/A;   Family History Family History  Problem Relation Age of Onset   Hypertension Mother    Obesity Mother    AAA (abdominal aortic aneurysm) Mother    Heart disease Mother    AAA (abdominal aortic aneurysm) Brother     Social History Social History   Tobacco Use   Smoking status: Never   Smokeless tobacco: Never   Tobacco comments:    Never smoke 06/29/21  Vaping Use   Vaping Use: Never used  Substance Use Topics   Alcohol use: Yes    Alcohol/week: 1.0 - 2.0 standard drink of alcohol    Types: 1 - 2 Cans of beer per week    Comment: 1 beer socially 06/29/21   Drug use: Never   Allergies Patient has no known allergies.  Review of  Systems Review of Systems  Respiratory:  Positive for shortness of breath.   Cardiovascular:  Positive for palpitations.    Physical Exam Vital Signs  I have reviewed the triage vital signs BP (!) 153/83   Pulse 65   Temp 97.7 F (36.5 C) (Oral)   Resp 18   Ht 5\' 10"  (1.778 m)   Wt 113.4 kg   SpO2 100%   BMI 35.87 kg/m   Physical Exam Constitutional:      General: He is not in acute distress.    Appearance: Normal appearance.  HENT:     Head: Normocephalic and atraumatic.     Nose: No congestion or rhinorrhea.  Eyes:     General:        Right eye: No discharge.        Left eye: No discharge.     Extraocular Movements: Extraocular movements intact.     Pupils: Pupils are equal, round, and reactive to light.  Cardiovascular:     Rate and Rhythm: Normal rate and regular rhythm.     Heart sounds: No murmur heard. Pulmonary:     Effort: No respiratory distress.     Breath sounds: No wheezing or rales.  Abdominal:     General: There is no distension.     Tenderness: There is no abdominal tenderness.  Musculoskeletal:        General: Normal range of motion.     Cervical back: Normal range of motion.  Skin:    General: Skin is warm and dry.  Neurological:     General: No focal deficit present.     Mental Status: He is alert.     ED Results and Treatments Labs (all labs ordered are listed, but only abnormal results are displayed) Labs Reviewed  COMPREHENSIVE METABOLIC PANEL - Abnormal; Notable for the following components:      Result Value   Glucose, Bld 102 (*)    Calcium 8.4 (*)    All other components within normal limits  CBC WITH DIFFERENTIAL/PLATELET - Abnormal; Notable for the following components:   Hemoglobin 11.6 (*)    HCT 37.0 (*)    All other components within normal limits  TROPONIN I (HIGH SENSITIVITY)  TROPONIN I (HIGH SENSITIVITY)  Radiology DG Chest Portable 1 View  Result Date: 09/13/2022 CLINICAL DATA:  Chest pain EXAM: PORTABLE CHEST - 1 VIEW COMPARISON:  06/24/2020 FINDINGS: Lungs are clear. Heart size and mediastinal contours are within normal limits. No effusion.  No pneumothorax. Visualized bones unremarkable. IMPRESSION: No acute cardiopulmonary disease. Electronically Signed   By: Corlis Leak M.D.   On: 09/13/2022 10:18    Pertinent labs & imaging results that were available during my care of the patient were reviewed by me and considered in my medical decision making (see MDM for details).  Medications Ordered in ED Medications - No data to display                                                                                                                                   Procedures Procedures  (including critical care time)  Medical Decision Making / ED Course   This patient presents to the ED for concern of palpitations, shortness of breath, this involves an extensive number of treatment options, and is a complaint that carries with it a high risk of complications and morbidity.  The differential diagnosis includes A-fib/flutter, outpatient medication regimen failure increased PVC burden, electrolyte abnormality, dehydration  MDM: Patient seen emergency room for evaluation of palpitations and shortness of breath.  Physical exam is largely unremarkable with no significant lower extremity edema or rales on lung exam.  Laboratory evaluation is reassuring outside of a hemoglobin of 11.6.  High-sensitivity troponin is normal.  Chest x-ray unremarkable.  ECG with normal sinus rhythm, currently not in a flutter or fib.  Patient is asking if he can change from taking 2 metoprolol pills in the morning and 1 at night to 2 metoprolol pills twice daily.  I spoke with the cardiologist on-call Dr. Shari Prows who recommends not making any medication adjustments today and instead opting for close cardiology  follow-up as the patient currently has a baseline heart rate in the upper 50s and we do not want to increase any AV nodal blocking agents at this time.  He is also currently on flecainide and it may need an evaluation for Tikosyn, but will certainly leave this to his outpatient cardiology providers.  At this time he does not meet inpatient criteria for admission and he is safe for discharge with cardiology follow-up.  Patient will call Dr. Evelina Bucy office tomorrow for follow-up.   Additional history obtained: -Additional history obtained from daughter -External records from outside source obtained and reviewed including: Chart review including previous notes, labs, imaging, consultation notes   Lab Tests: -I ordered, reviewed, and interpreted labs.   The pertinent results include:   Labs Reviewed  COMPREHENSIVE METABOLIC PANEL - Abnormal; Notable for the following components:      Result Value   Glucose, Bld 102 (*)    Calcium 8.4 (*)    All other components within normal limits  CBC WITH DIFFERENTIAL/PLATELET - Abnormal; Notable for the  following components:   Hemoglobin 11.6 (*)    HCT 37.0 (*)    All other components within normal limits  TROPONIN I (HIGH SENSITIVITY)  TROPONIN I (HIGH SENSITIVITY)      EKG   EKG Interpretation  Date/Time:  Monday September 13 2022 09:37:59 EDT Ventricular Rate:  67 PR Interval:  193 QRS Duration: 126 QT Interval:  399 QTC Calculation: 422 R Axis:   25 Text Interpretation: Sinus rhythm Nonspecific intraventricular conduction delay Confirmed by Atasha Colebank ([id number]) on 09/13/2022 9:40:51 AM         Imaging Studies ordered: I ordered imaging studies including chest x-ray I independently visualized and interpreted imaging. I agree with the radiologist interpretation   Medicines ordered and prescription drug management: No orders of the defined types were placed in this encounter.   -I have reviewed the patients home medicines and have  made adjustments as needed  Critical interventions none  Consultations Obtained: I requested consultation with the cardiologist on-call Dr. Shari Prows,  and discussed lab and imaging findings as well as pertinent plan - they recommend: Close cardiology follow-up, no medication changes at this time   Cardiac Monitoring: The patient was maintained on a cardiac monitor.  I personally viewed and interpreted the cardiac monitored which showed an underlying rhythm of: NSR  Social Determinants of Health:  Factors impacting patients care include: Spanish-speaking   Reevaluation: After the interventions noted above, I reevaluated the patient and found that they have :stayed the same  Co morbidities that complicate the patient evaluation  Past Medical History:  Diagnosis Date   A-fib (HCC)    Heartburn    HTN (hypertension)    OSA (obstructive sleep apnea)    Osteoarthritis    knee   Overweight    Sleep apnea    SOBOE (shortness of breath on exertion)       Dispostion: I considered admission for this patient, but at this time he does not meet inpatient criteria for admission and he is safe for discharge with outpatient follow-up     Final Clinical Impression(s) / ED Diagnoses Final diagnoses:  None     @PCDICTATION @    Glendora Score, MD 09/13/22 2142

## 2022-10-04 ENCOUNTER — Ambulatory Visit (HOSPITAL_COMMUNITY)
Admission: RE | Admit: 2022-10-04 | Discharge: 2022-10-04 | Disposition: A | Discharge status: Home / Self Care | Payer: Medicare (Managed Care) | Source: Ambulatory Visit | Attending: Physician Assistant | Admitting: Physician Assistant

## 2022-10-04 ENCOUNTER — Encounter (HOSPITAL_COMMUNITY): Payer: Self-pay | Admitting: Physician Assistant

## 2022-10-04 VITALS — BP 128/78 | HR 66 | Ht 70.0 in | Wt 248.0 lb

## 2022-10-04 DIAGNOSIS — I4819 Other persistent atrial fibrillation: Secondary | ICD-10-CM

## 2022-10-04 DIAGNOSIS — G4733 Obstructive sleep apnea (adult) (pediatric): Secondary | ICD-10-CM | POA: Diagnosis not present

## 2022-10-04 DIAGNOSIS — Z7901 Long term (current) use of anticoagulants: Secondary | ICD-10-CM | POA: Diagnosis not present

## 2022-10-04 DIAGNOSIS — Z6835 Body mass index (BMI) 35.0-35.9, adult: Secondary | ICD-10-CM | POA: Diagnosis not present

## 2022-10-04 DIAGNOSIS — I340 Nonrheumatic mitral (valve) insufficiency: Secondary | ICD-10-CM | POA: Diagnosis not present

## 2022-10-04 DIAGNOSIS — D6869 Other thrombophilia: Secondary | ICD-10-CM | POA: Insufficient documentation

## 2022-10-04 DIAGNOSIS — I10 Essential (primary) hypertension: Secondary | ICD-10-CM | POA: Diagnosis not present

## 2022-10-04 DIAGNOSIS — Z79899 Other long term (current) drug therapy: Secondary | ICD-10-CM | POA: Insufficient documentation

## 2022-10-04 DIAGNOSIS — Z5181 Encounter for therapeutic drug level monitoring: Secondary | ICD-10-CM

## 2022-10-04 DIAGNOSIS — E669 Obesity, unspecified: Secondary | ICD-10-CM | POA: Diagnosis not present

## 2022-10-04 MED ORDER — METOPROLOL SUCCINATE ER 25 MG PO TB24: ORAL_TABLET | ORAL | 2 refills | Status: DC

## 2022-10-04 NOTE — Progress Notes (Signed)
Primary Care Physician: Pcp, No Primary Cardiologist: Dr Eldridge Dace  Primary Electrophysiologist: none Referring Physician: [facility]   Daryl [name] is a 67 y.o. male with a history of HTN, OSA, and atrial fibrillation who presents for follow up in the [facility].  The patient was initially diagnosed with atrial fibrillation 06/24/20 after presenting to the ED with symptoms of chest presure. ECG showed afib with RVR. He underwent DCCV at that time and his symptoms resolved. Patient was started on Eliquis for a CHADS2VASC score of 2. Patient does have a diagnosis of severe OSA but has not yet had CPAP titration. His echo showed EF 50%, mild MR. Patient is s/p repeat DCCV on 07/14/20 after starting flecainide.   An interpreter was used for today's visit. On follow up today, patient went to the ED 09/13/22 with a one week history of increased tachypalpitations. At the ED he was in SR and workup was unremarkable. The patient realized he had only been taking metoprolol 25 mg BID instead of 50 mg in the AM and 25 mg PM. Once he increased his dose, his fluttering resolved.   Today, he denies symptoms of palpitations, chest pain, shortness of breath, orthopnea, PND, lower extremity edema, dizziness, presyncope, syncope, bleeding, or neurologic sequela. The patient is tolerating medications without difficulties and is otherwise without complaint today.    Atrial Fibrillation Risk Factors:  he does have symptoms or diagnosis of sleep apnea. he is compliant with CPAP therapy.  he does not have a history of rheumatic fever. he does not have a history of alcohol use. The patient does not have a history of early familial atrial fibrillation or other arrhythmias.   Atrial Fibrillation Management history:  Previous antiarrhythmic drugs: flecainide  Previous cardioversions: 06/24/20, 07/14/20 Previous ablations: none Anticoagulation history: Eliquis   Past Medical History:   Diagnosis Date   A-fib (HCC)    Heartburn    HTN (hypertension)    OSA (obstructive sleep apnea)    Osteoarthritis    knee   Overweight    Sleep apnea    SOBOE (shortness of breath on exertion)    ROS- All systems are reviewed and negative except as per the HPI above.  Physical Exam: Vitals:   10/04/22 1533  BP: 128/78  Pulse: 66  Weight: 112.5 kg  Height: 5\' 10"  (1.778 m)    GEN: Well nourished, well developed in no acute distress NECK: No JVD; No carotid bruits CARDIAC: Regular rate and rhythm, no murmurs, rubs, gallops RESPIRATORY:  Clear to auscultation without rales, wheezing or rhonchi  ABDOMEN: Soft, non-tender, non-distended EXTREMITIES:  No edema; No deformity    Wt Readings from Last 3 Encounters:  10/04/22 112.5 kg  09/13/22 113.4 kg  06/22/22 127 kg    EKG today demonstrates  SR HR 66 bpm PR 194 ms QRS 122 ms QT/QTc 436 ms  Echo 07/10/20 demonstrated 1. Left ventricular ejection fraction, by estimation, is 50%. The left  ventricle has low normal function. The left ventricle has no regional wall motion abnormalities. Left ventricular diastolic parameters are  indeterminate.   2. Right ventricular systolic function is mildly reduced. The right  ventricular size is normal.   3. The mitral valve is normal in structure. Mild mitral valve  regurgitation.   4. The aortic valve is tricuspid. Aortic valve regurgitation is trivial.  Mild aortic valve sclerosis is present, with no evidence of aortic valve  stenosis.   5. The inferior  vena cava is normal in size with greater than 50%  respiratory variability, suggesting right atrial pressure of 3 mmHg.   Epic records are reviewed at length today  CHA2DS2-VASc Score = 2  The patient's score is based upon: CHF History: 0 HTN History: 1 Diabetes History: 0 Stroke History: 0 Vascular Disease History: 0 Age Score: 1 Gender Score: 0        ASSESSMENT AND PLAN: 1. Persistent Atrial Fibrillation  (ICD10:  I48.19) The patient's CHA2DS2-VASc score is 2, indicating a 2.2% annual risk of stroke.   Symptoms resolved with increased BB. Could consider alternate AAD or ablation if he has more frequent symptoms.  Continue Toprol 50 mg AM and 25 mg PM Continue flecainide 100 mg BID Continue Xarelto 20 mg daily  2. Secondary Hypercoagulable State (ICD10:  D68.69) The patient is at significant risk for stroke/thromboembolism based upon his CHA2DS2-VASc Score of 2.  Continue Rivaroxaban (Xarelto).    Obesity Body mass index is 35.58 kg/m.  Encouraged lifestyle modification Followed by [facility] nutrition.   Obstructive sleep apnea Diagnosed with severe OSA 11/05/19 Followed by [facility] sleep medicine.  Encouraged nightly CPAP  HTN Stable on current regimen   Follow up in the AF clinic in 6 months.    Jorja Loa PA-C Afib Clinic [facility] [address] [place], [place] [address] [phone] 10/04/2022 4:27 PM

## 2022-10-06 ENCOUNTER — Other Ambulatory Visit (HOSPITAL_COMMUNITY): Payer: Self-pay | Admitting: Physician Assistant

## 2022-12-09 ENCOUNTER — Ambulatory Visit (HOSPITAL_COMMUNITY): Payer: Medicare (Managed Care) | Admitting: Physician Assistant

## 2023-03-24 ENCOUNTER — Ambulatory Visit (HOSPITAL_COMMUNITY): Payer: Medicare (Managed Care) | Admitting: Physician Assistant

## 2023-04-11 ENCOUNTER — Ambulatory Visit (INDEPENDENT_AMBULATORY_CARE_PROVIDER_SITE_OTHER): Payer: Medicare (Managed Care) | Admitting: Physician Assistant

## 2023-04-11 VITALS — Ht 69.0 in | Wt 277.0 lb

## 2023-04-11 DIAGNOSIS — M1711 Unilateral primary osteoarthritis, right knee: Secondary | ICD-10-CM

## 2023-04-11 NOTE — Progress Notes (Signed)
HPI: [name] comes in today to discuss his right knee.  He has known bilateral knee arthritis.  Right knee is more bothersome than the lateral.  He is also asking for handicap permit.  He is 22 undergo right total knee arthroplasty sometime in late July or early August.  He does have a trip planned for mid summer.  He presents today with an interpreter.  Review of systems: See HPI otherwise negative  Physical exam: General Well-developed well-nourished male in no acute distress mood affect appropriate.  Ambulates without any assistive device.  Impression: Right knee osteoarthritis  Plan: Will see him back prior to to his trip for injections in both knees.  Then see him back after the trip to discuss right knee surgery that.  He will need to be off his Xarelto and also will need cardiac clearance prior to surgery.  Will need a height and weight at return visit.  Discussed with him the need for him to be under a BMI 40.  Questions were encouraged and answered using interpreter today.  Handicap permit was given.

## 2023-05-26 ENCOUNTER — Ambulatory Visit (HOSPITAL_COMMUNITY)
Admission: RE | Admit: 2023-05-26 | Discharge: 2023-05-26 | Disposition: A | Discharge status: Home / Self Care | Payer: Medicare (Managed Care) | Source: Ambulatory Visit | Attending: Physician Assistant | Admitting: Physician Assistant

## 2023-05-26 VITALS — BP 112/66 | HR 70 | Ht 69.0 in | Wt 277.6 lb

## 2023-05-26 DIAGNOSIS — D6869 Other thrombophilia: Secondary | ICD-10-CM | POA: Diagnosis not present

## 2023-05-26 DIAGNOSIS — I10 Essential (primary) hypertension: Secondary | ICD-10-CM | POA: Insufficient documentation

## 2023-05-26 DIAGNOSIS — Z6841 Body Mass Index (BMI) 40.0 and over, adult: Secondary | ICD-10-CM | POA: Insufficient documentation

## 2023-05-26 DIAGNOSIS — Z5181 Encounter for therapeutic drug level monitoring: Secondary | ICD-10-CM | POA: Insufficient documentation

## 2023-05-26 DIAGNOSIS — I4819 Other persistent atrial fibrillation: Secondary | ICD-10-CM

## 2023-05-26 DIAGNOSIS — Z79899 Other long term (current) drug therapy: Secondary | ICD-10-CM

## 2023-05-26 DIAGNOSIS — E669 Obesity, unspecified: Secondary | ICD-10-CM | POA: Diagnosis not present

## 2023-05-26 DIAGNOSIS — G4733 Obstructive sleep apnea (adult) (pediatric): Secondary | ICD-10-CM | POA: Diagnosis not present

## 2023-05-26 DIAGNOSIS — Z7901 Long term (current) use of anticoagulants: Secondary | ICD-10-CM | POA: Insufficient documentation

## 2023-05-26 NOTE — Progress Notes (Signed)
Primary Care Physician: Pcp, No Primary Cardiologist: Dr Eldridge Dace  Primary Electrophysiologist: none Referring Physician: [facility]   Daryl [name] is a 68 y.o. male with a history of HTN, OSA, and atrial fibrillation who presents for follow up in the [facility].  The patient was initially diagnosed with atrial fibrillation 06/24/20 after presenting to the ED with symptoms of chest presure. ECG showed afib with RVR. He underwent DCCV at that time and his symptoms resolved. Patient was started on Eliquis for a CHADS2VASC score of 2. Patient does have a diagnosis of severe OSA but has not yet had CPAP titration. His echo showed EF 50%, mild MR. Patient is s/p repeat DCCV on 07/14/20 after starting flecainide.   An interpreter was used for today's visit. Patient returns for follow up for atrial fibrillation and flecainide monitoring. He reports that he has done well since his last visit with no interim symptoms of afib. He denies any bleeding issues on anticoagulation. He is compliant with his CPAP. He has had 2-3 episodes of dizziness when first getting up in the morning which lasted for 2-3 minutes then resolved.   Today, he denies symptoms of palpitations, chest pain, shortness of breath, orthopnea, PND, lower extremity edema, presyncope, syncope, bleeding, or neurologic sequela. The patient is tolerating medications without difficulties and is otherwise without complaint today.    Atrial Fibrillation Risk Factors:  he does have symptoms or diagnosis of sleep apnea. he does not have a history of rheumatic fever. he does not have a history of alcohol use. The patient does not have a history of early familial atrial fibrillation or other arrhythmias.   Atrial Fibrillation Management history:  Previous antiarrhythmic drugs: flecainide  Previous cardioversions: 06/24/20, 07/14/20 Previous ablations: none Anticoagulation history: Eliquis   Past Medical  History:  Diagnosis Date   A-fib (HCC)    Heartburn    HTN (hypertension)    OSA (obstructive sleep apnea)    Osteoarthritis    knee   Overweight    Sleep apnea    SOBOE (shortness of breath on exertion)    ROS- All systems are reviewed and negative except as per the HPI above.  Physical Exam: Vitals:   05/26/23 1506  BP: 112/66  Pulse: 70  Weight: 125.9 kg  Height: 5\' 9"  (1.753 m)     GEN: Well nourished, well developed in no acute distress CARDIAC: Regular rate and rhythm, no murmurs, rubs, gallops RESPIRATORY:  Clear to auscultation without rales, wheezing or rhonchi  ABDOMEN: Soft, non-tender, non-distended EXTREMITIES:  No edema; No deformity    Wt Readings from Last 3 Encounters:  05/26/23 125.9 kg  04/11/23 125.6 kg  10/04/22 112.5 kg    EKG today demonstrates  SR Vent. rate 70 BPM PR interval 194 ms QRS duration 120 ms QT/QTcB 406/438 ms   Echo 07/10/20 demonstrated 1. Left ventricular ejection fraction, by estimation, is 50%. The left  ventricle has low normal function. The left ventricle has no regional wall motion abnormalities. Left ventricular diastolic parameters are  indeterminate.   2. Right ventricular systolic function is mildly reduced. The right  ventricular size is normal.   3. The mitral valve is normal in structure. Mild mitral valve  regurgitation.   4. The aortic valve is tricuspid. Aortic valve regurgitation is trivial.  Mild aortic valve sclerosis is present, with no evidence of aortic valve  stenosis.   5. The inferior vena cava is normal in size with  greater than 50%  respiratory variability, suggesting right atrial pressure of 3 mmHg.   Epic records are reviewed at length today  CHA2DS2-VASc Score = 2  The patient's score is based upon: CHF History: 0 HTN History: 1 Diabetes History: 0 Stroke History: 0 Vascular Disease History: 0 Age Score: 1 Gender Score: 0       ASSESSMENT AND PLAN: Persistent Atrial  Fibrillation (ICD10:  I48.19) The patient's CHA2DS2-VASc score is 2, indicating a 2.2% annual risk of stroke.   Patient appears to be maintaining SR Continue flecainide 100 mg BID Continue Toprol 50 mg AM and 25 mg PM Continue Xarelto 20 mg daily  Secondary Hypercoagulable State (ICD10:  D68.69) The patient is at significant risk for stroke/thromboembolism based upon his CHA2DS2-VASc Score of 2.  Continue Rivaroxaban (Xarelto). No bleeding issues.   High Risk Medication Monitoring (ICD 10: Z79.899) PR and QRS intervals on ECG appropriate for flecainide monitoring.    Obesity Body mass index is 40.99 kg/m.  Encouraged lifestyle modification Followed at [facility] nutrition  OSA  Diagnosed with severe OSA 11/05/19 Encouraged nightly CPAP Followed by [facility] sleep   HTN Stable on current regimen   Follow up in the AF clinic in 6 months.    Jorja Loa PA-C Afib Clinic [facility] [address] [place], [place] [address] [phone] 05/26/2023 3:25 PM

## 2023-06-17 ENCOUNTER — Other Ambulatory Visit (HOSPITAL_COMMUNITY): Payer: Self-pay | Admitting: Physician Assistant

## 2023-07-02 ENCOUNTER — Other Ambulatory Visit (HOSPITAL_COMMUNITY): Payer: Self-pay | Admitting: Physician Assistant

## 2023-07-27 ENCOUNTER — Other Ambulatory Visit (HOSPITAL_COMMUNITY): Payer: Self-pay | Admitting: Physician Assistant

## 2023-09-02 ENCOUNTER — Ambulatory Visit (HOSPITAL_COMMUNITY)
Admission: RE | Admit: 2023-09-02 | Discharge: 2023-09-02 | Disposition: A | Discharge status: Home / Self Care | Payer: Medicare (Managed Care) | Source: Ambulatory Visit | Attending: Physician Assistant | Admitting: Physician Assistant

## 2023-09-02 ENCOUNTER — Encounter (HOSPITAL_COMMUNITY): Payer: Self-pay | Admitting: Physician Assistant

## 2023-09-02 VITALS — BP 104/60 | HR 91 | Ht 69.0 in | Wt 277.2 lb

## 2023-09-02 DIAGNOSIS — I10 Essential (primary) hypertension: Secondary | ICD-10-CM

## 2023-09-02 DIAGNOSIS — I4819 Other persistent atrial fibrillation: Secondary | ICD-10-CM | POA: Diagnosis not present

## 2023-09-02 DIAGNOSIS — D6869 Other thrombophilia: Secondary | ICD-10-CM

## 2023-09-02 MED ORDER — LISINOPRIL 20 MG PO TABS: 20.0000 mg | ORAL_TABLET | Freq: Every day | ORAL | 2 refills | Status: DC

## 2023-09-02 MED ORDER — METOPROLOL SUCCINATE ER 25 MG PO TB24: 25.0000 mg | ORAL_TABLET | Freq: Two times a day (BID) | ORAL | 1 refills | Status: AC

## 2023-09-02 NOTE — Patient Instructions (Signed)
 Decrease metoprolol  to 25mg  twice a day   Stop lisinopril/hydrochlorothiazide    Start lisinopril 20mg  once a day

## 2023-09-02 NOTE — Progress Notes (Signed)
 Primary Care Physician: Pcp, No Primary Cardiologist: Dr Jacquelynn Matter  Primary Electrophysiologist: none Referring Physician: [facility]   Daryl [name] is a 68 y.o. male with a history of HTN, OSA, and atrial fibrillation who presents for follow up in the [facility].  The patient was initially diagnosed with atrial fibrillation 06/24/20 after presenting to the ED with symptoms of chest presure. ECG showed afib with RVR. He underwent DCCV at that time and his symptoms resolved. Patient was started on Eliquis  for a CHADS2VASC score of 2. Patient does have a diagnosis of severe OSA but has not yet had CPAP titration. His echo showed EF 50%, mild MR. Patient is s/p repeat DCCV on 07/14/20 after starting flecainide .   An interpreter was used for today's visit. Patient returns for follow up for atrial fibrillation, flecainide  monitoring and dizziness. Patient reports that for the past 1-2 weeks he has noticed low BP readings 90s/60s on his home machine. He does have associated dizziness with positional change. He has also had some hemorrhoidal bleeding lately which has been intermittent for him for years. He did not take his BP medications this AM.   Today, he  denies symptoms of palpitations, chest pain, shortness of breath, orthopnea, PND, lower extremity edema, presyncope, syncope, or neurologic sequela. The patient is tolerating medications without difficulties and is otherwise without complaint today.    Atrial Fibrillation Risk Factors:  he does have symptoms or diagnosis of sleep apnea. he does not have a history of rheumatic fever. he does not have a history of alcohol use. The patient does not have a history of early familial atrial fibrillation or other arrhythmias.   Atrial Fibrillation Management history:  Previous antiarrhythmic drugs: flecainide   Previous cardioversions: 06/24/20, 07/14/20 Previous ablations: none Anticoagulation history:  Eliquis    Past Medical History:  Diagnosis Date   A-fib (HCC)    Heartburn    HTN (hypertension)    OSA (obstructive sleep apnea)    Osteoarthritis    knee   Overweight    Sleep apnea    SOBOE (shortness of breath on exertion)    ROS- All systems are reviewed and negative except as per the HPI above.  Physical Exam: Vitals:   09/02/23 0853  BP: 104/60  Pulse: 91  Weight: 125.7 kg  Height: 5\' 9"  (1.753 m)     GEN: Well nourished, well developed in no acute distress CARDIAC: Regular rate and rhythm, no murmurs, rubs, gallops RESPIRATORY:  Clear to auscultation without rales, wheezing or rhonchi  ABDOMEN: Soft, non-tender, non-distended EXTREMITIES:  No edema; No deformity    Wt Readings from Last 3 Encounters:  09/02/23 125.7 kg  05/26/23 125.9 kg  04/11/23 125.6 kg    EKG today demonstrates  SR Vent. rate 91 BPM PR interval 184 ms QRS duration 118 ms QT/QTcB 376/462 ms   Echo 07/10/20 demonstrated 1. Left ventricular ejection fraction, by estimation, is 50%. The left  ventricle has low normal function. The left ventricle has no regional wall motion abnormalities. Left ventricular diastolic parameters are  indeterminate.   2. Right ventricular systolic function is mildly reduced. The right  ventricular size is normal.   3. The mitral valve is normal in structure. Mild mitral valve  regurgitation.   4. The aortic valve is tricuspid. Aortic valve regurgitation is trivial.  Mild aortic valve sclerosis is present, with no evidence of aortic valve  stenosis.   5. The inferior vena cava is normal in  size with greater than 50%  respiratory variability, suggesting right atrial pressure of 3 mmHg.   Epic records are reviewed at length today  CHA2DS2-VASc Score = 2  The patient's score is based upon: CHF History: 0 HTN History: 1 Diabetes History: 0 Stroke History: 0 Vascular Disease History: 0 Age Score: 1 Gender Score: 0       ASSESSMENT AND  PLAN: Persistent Atrial Fibrillation (ICD10:  I48.19) The patient's CHA2DS2-VASc score is 2, indicating a 2.2% annual risk of stroke.   Patient appears to be maintaining SR Continue flecainide  100 mg BID Decrease Toprol  to 25 mg BID Continue Xarelto  20 mg daily  Secondary Hypercoagulable State (ICD10:  D68.69) The patient is at significant risk for stroke/thromboembolism based upon his CHA2DS2-VASc Score of 2.  Continue Rivaroxaban  (Xarelto ). Has a longstanding history of hemorrhoids with intermittent bleeding. Check cbc/bmet today.    High Risk Medication Monitoring (ICD 10: Z79.899) Intervals on ECG acceptable for flecainide  monitoring.      Obesity Body mass index is 40.94 kg/m.  Encouraged lifestyle modification Followed at [facility] medical nutrition  OSA  Encouraged nightly CPAP Followed by [facility] sleep  HTN Borderline low today and does have low readings at home associated with dizziness. Will stop lisinopril hydrochlorothiazide  and start lisinopril 20 mg daily alone. Decrease BB as above.    Follow up in the AF clinic in 6-8 weeks.    Myrtha Ates PA-C Afib Clinic [facility] [address] [place], [place] [address] [phone] 09/02/2023 9:32 AM

## 2023-09-03 ENCOUNTER — Emergency Department (HOSPITAL_COMMUNITY): Payer: Medicare (Managed Care)

## 2023-09-03 ENCOUNTER — Other Ambulatory Visit: Payer: Self-pay

## 2023-09-03 ENCOUNTER — Encounter (HOSPITAL_COMMUNITY): Payer: Self-pay | Admitting: Internal Medicine

## 2023-09-03 ENCOUNTER — Observation Stay (HOSPITAL_COMMUNITY)
Admission: EM | Admit: 2023-09-03 | Discharge: 2023-09-08 | Disposition: A | Discharge status: Home / Self Care | Payer: Medicare (Managed Care) | Attending: Internal Medicine | Admitting: Internal Medicine

## 2023-09-03 DIAGNOSIS — I480 Paroxysmal atrial fibrillation: Secondary | ICD-10-CM | POA: Diagnosis not present

## 2023-09-03 DIAGNOSIS — D500 Iron deficiency anemia secondary to blood loss (chronic): Secondary | ICD-10-CM | POA: Diagnosis present

## 2023-09-03 DIAGNOSIS — Z79899 Other long term (current) drug therapy: Secondary | ICD-10-CM | POA: Diagnosis not present

## 2023-09-03 DIAGNOSIS — K922 Gastrointestinal hemorrhage, unspecified: Principal | ICD-10-CM | POA: Insufficient documentation

## 2023-09-03 DIAGNOSIS — Z5181 Encounter for therapeutic drug level monitoring: Secondary | ICD-10-CM

## 2023-09-03 DIAGNOSIS — K642 Third degree hemorrhoids: Secondary | ICD-10-CM | POA: Diagnosis not present

## 2023-09-03 DIAGNOSIS — N179 Acute kidney failure, unspecified: Secondary | ICD-10-CM

## 2023-09-03 DIAGNOSIS — D649 Anemia, unspecified: Principal | ICD-10-CM

## 2023-09-03 DIAGNOSIS — K5900 Constipation, unspecified: Secondary | ICD-10-CM | POA: Diagnosis not present

## 2023-09-03 DIAGNOSIS — I10 Essential (primary) hypertension: Secondary | ICD-10-CM | POA: Diagnosis not present

## 2023-09-03 DIAGNOSIS — Z7901 Long term (current) use of anticoagulants: Secondary | ICD-10-CM | POA: Insufficient documentation

## 2023-09-03 DIAGNOSIS — K5790 Diverticulosis of intestine, part unspecified, without perforation or abscess without bleeding: Secondary | ICD-10-CM | POA: Insufficient documentation

## 2023-09-03 DIAGNOSIS — R42 Dizziness and giddiness: Secondary | ICD-10-CM | POA: Diagnosis present

## 2023-09-03 DIAGNOSIS — G4733 Obstructive sleep apnea (adult) (pediatric): Secondary | ICD-10-CM | POA: Diagnosis not present

## 2023-09-03 DIAGNOSIS — K921 Melena: Secondary | ICD-10-CM | POA: Diagnosis not present

## 2023-09-03 DIAGNOSIS — E669 Obesity, unspecified: Secondary | ICD-10-CM | POA: Insufficient documentation

## 2023-09-03 DIAGNOSIS — D509 Iron deficiency anemia, unspecified: Secondary | ICD-10-CM | POA: Insufficient documentation

## 2023-09-03 LAB — CBC WITH DIFFERENTIAL/PLATELET
Abs Immature Granulocytes: 0.04 10*3/uL (ref 0.00–0.07)
Basophils Absolute: 0.1 10*3/uL (ref 0.0–0.1)
Basophils Relative: 1 %
Eosinophils Absolute: 0.2 10*3/uL (ref 0.0–0.5)
Eosinophils Relative: 3 %
HCT: 23.4 % — ABNORMAL LOW (ref 39.0–52.0)
Hemoglobin: 6.6 g/dL — CL (ref 13.0–17.0)
Immature Granulocytes: 1 %
Lymphocytes Relative: 27 %
Lymphs Abs: 1.9 10*3/uL (ref 0.7–4.0)
MCH: 18.8 pg — ABNORMAL LOW (ref 26.0–34.0)
MCHC: 28.2 g/dL — ABNORMAL LOW (ref 30.0–36.0)
MCV: 66.5 fL — ABNORMAL LOW (ref 80.0–100.0)
Monocytes Absolute: 0.6 10*3/uL (ref 0.1–1.0)
Monocytes Relative: 9 %
Neutro Abs: 4.2 10*3/uL (ref 1.7–7.7)
Neutrophils Relative %: 59 %
Platelets: 428 10*3/uL — ABNORMAL HIGH (ref 150–400)
RBC: 3.52 MIL/uL — ABNORMAL LOW (ref 4.22–5.81)
RDW: 17.4 % — ABNORMAL HIGH (ref 11.5–15.5)
WBC: 7.1 10*3/uL (ref 4.0–10.5)
nRBC: 0 % (ref 0.0–0.2)

## 2023-09-03 LAB — CBC
Hematocrit: 24.5 % — ABNORMAL LOW (ref 37.5–51.0)
Hemoglobin: 6.6 g/dL — CL (ref 13.0–17.7)
MCH: 18.2 pg — ABNORMAL LOW (ref 26.6–33.0)
MCHC: 26.9 g/dL — ABNORMAL LOW (ref 31.5–35.7)
MCV: 68 fL — ABNORMAL LOW (ref 79–97)
Platelets: 437 10*3/uL (ref 150–450)
RBC: 3.63 x10E6/uL — ABNORMAL LOW (ref 4.14–5.80)
RDW: 16.2 % — ABNORMAL HIGH (ref 11.6–15.4)
WBC: 7.7 10*3/uL (ref 3.4–10.8)

## 2023-09-03 LAB — COMPREHENSIVE METABOLIC PANEL WITH GFR
ALT: 11 U/L (ref 0–44)
AST: 18 U/L (ref 15–41)
Albumin: 3.9 g/dL (ref 3.5–5.0)
Alkaline Phosphatase: 70 U/L (ref 38–126)
Anion gap: 8 (ref 5–15)
BUN: 21 mg/dL (ref 8–23)
CO2: 25 mmol/L (ref 22–32)
Calcium: 9 mg/dL (ref 8.9–10.3)
Chloride: 101 mmol/L (ref 98–111)
Creatinine, Ser: 1.59 mg/dL — ABNORMAL HIGH (ref 0.61–1.24)
GFR, Estimated: 47 mL/min — ABNORMAL LOW (ref >60)
Glucose, Bld: 112 mg/dL — ABNORMAL HIGH (ref 70–99)
Potassium: 3.7 mmol/L (ref 3.5–5.1)
Sodium: 134 mmol/L — ABNORMAL LOW (ref 135–145)
Total Bilirubin: 0.8 mg/dL (ref 0.0–1.2)
Total Protein: 7.7 g/dL (ref 6.5–8.1)

## 2023-09-03 LAB — BASIC METABOLIC PANEL WITH GFR
BUN/Creatinine Ratio: 12 (ref 10–24)
BUN: 20 mg/dL (ref 8–27)
CO2: 20 mmol/L (ref 20–29)
Calcium: 9.3 mg/dL (ref 8.6–10.2)
Chloride: 102 mmol/L (ref 96–106)
Creatinine, Ser: 1.67 mg/dL — ABNORMAL HIGH (ref 0.76–1.27)
Glucose: 97 mg/dL (ref 70–99)
Potassium: 4.3 mmol/L (ref 3.5–5.2)
Sodium: 140 mmol/L (ref 134–144)
eGFR: 45 mL/min/{1.73_m2} — ABNORMAL LOW (ref >59)

## 2023-09-03 LAB — ABO/RH: ABO/RH(D): O POS

## 2023-09-03 LAB — PREPARE RBC (CROSSMATCH)

## 2023-09-03 LAB — PROTIME-INR
INR: 1.9 — ABNORMAL HIGH (ref 0.8–1.2)
Prothrombin Time: 21.6 s — ABNORMAL HIGH (ref 11.4–15.2)

## 2023-09-03 MED ORDER — SODIUM CHLORIDE 0.9% IV SOLUTION: Freq: Once | INTRAVENOUS | Status: AC

## 2023-09-03 MED ORDER — ACETAMINOPHEN 325 MG PO TABS
650.0000 mg | ORAL_TABLET | Freq: Four times a day (QID) | ORAL | Status: DC | PRN
  Administered 2023-09-04 – 2023-09-06 (×3): 650 mg via ORAL
  Filled 2023-09-03 (×4): qty 2

## 2023-09-03 MED ORDER — ONDANSETRON HCL 4 MG/2ML IJ SOLN: 4.0000 mg | Freq: Four times a day (QID) | INTRAMUSCULAR | Status: DC | PRN

## 2023-09-03 MED ORDER — ACETAMINOPHEN 650 MG RE SUPP: 650.0000 mg | Freq: Four times a day (QID) | RECTAL | Status: DC | PRN

## 2023-09-03 MED ORDER — PNEUMOCOCCAL 20-VAL CONJ VACC 0.5 ML IM SUSY
0.5000 mL | PREFILLED_SYRINGE | INTRAMUSCULAR | Status: DC
  Filled 2023-09-03: qty 0.5

## 2023-09-03 MED ORDER — FLECAINIDE ACETATE 100 MG PO TABS
100.0000 mg | ORAL_TABLET | Freq: Two times a day (BID) | ORAL | Status: DC
Start: 2023-09-03 — End: 2023-09-08
  Administered 2023-09-03 – 2023-09-08 (×10): 100 mg via ORAL
  Filled 2023-09-03 (×10): qty 1

## 2023-09-03 MED ORDER — ALBUTEROL SULFATE (2.5 MG/3ML) 0.083% IN NEBU: 2.5000 mg | INHALATION_SOLUTION | RESPIRATORY_TRACT | Status: DC | PRN

## 2023-09-03 MED ORDER — IOHEXOL 350 MG/ML SOLN
80.0000 mL | Freq: Once | INTRAVENOUS | Status: AC | PRN
  Administered 2023-09-03: 80 mL via INTRAVENOUS

## 2023-09-03 MED ORDER — ONDANSETRON HCL 4 MG PO TABS: 4.0000 mg | ORAL_TABLET | Freq: Four times a day (QID) | ORAL | Status: DC | PRN

## 2023-09-03 MED ORDER — METOPROLOL SUCCINATE ER 25 MG PO TB24
25.0000 mg | ORAL_TABLET | Freq: Two times a day (BID) | ORAL | Status: DC
  Administered 2023-09-03 – 2023-09-08 (×10): 25 mg via ORAL
  Filled 2023-09-03 (×10): qty 1

## 2023-09-03 NOTE — H&P (Signed)
 History and Physical  Daryl [name] ZOX:[id number] DOB: 04-25-1955 DOA: 09/03/2023  PCP: Pcp, No   Chief Complaint: Rectal bleeding, dizziness   HPI: Daryl [name] is a 68 y.o. male with medical history significant for OSA, atrial fibrillation on Xarelto  being admitted to the hospital lower GI bleeding and blood loss anemia.  Patient is seen and examined with the assistance of electronic Spanish interpreter.  States that for the last several years he has had intermittent painless bright red rectal bleeding.  Stool was not melanotic.  Since he started taking Xarelto , the bleeding has been a little bit more frequent, and lasts a little longer each time it happens.  States that due to constipation he uses suppositories, often after using suppository, he feels sensation of fullness or inflammation in his rectum, and then the next couple of days he has some rectal bleeding.  He had a follow-up appointment with cardiology yesterday, where he complained of some dizziness, he was noted to have a low blood pressure, lab work done yesterday showed hemoglobin of 6.6.  Came to the ER for evaluation, here hemoglobin is stable.  He was ordered 1 unit PRBC transfusion, and hospitalist was contacted for admission.  Review of Systems: Please see HPI for pertinent positives and negatives. A complete 10 system review of systems are otherwise negative.  Past Medical History:  Diagnosis Date   A-fib (HCC)    Heartburn    HTN (hypertension)    OSA (obstructive sleep apnea)    Osteoarthritis    knee   Overweight    Sleep apnea    SOBOE (shortness of breath on exertion)    Past Surgical History:  Procedure Laterality Date   CARDIOVERSION N/A 07/14/2020   Procedure: CARDIOVERSION;  Surgeon: Jann Melody, MD;  Location: [facility];  Service: Cardiovascular;  Laterality: N/A;   Social History:  reports that he has never smoked. He has never used smokeless tobacco. He reports that he does not currently  use alcohol after a past usage of about 1.0 - 2.0 standard drink of alcohol per week. He reports that he does not use drugs.  No Known Allergies  Family History  Problem Relation Age of Onset   Hypertension Mother    Obesity Mother    AAA (abdominal aortic aneurysm) Mother    Heart disease Mother    AAA (abdominal aortic aneurysm) Brother      Prior to Admission medications   Medication Sig Start Date End Date Taking? Authorizing Provider  acetaminophen (TYLENOL) 500 MG tablet Take 500 mg by mouth as needed for mild pain (pain score 1-3) or moderate pain (pain score 4-6).    [provider]  flecainide  (TAMBOCOR ) 100 MG tablet TAKE ONE TABLET BY MOUTH TWICE DAILY 07/05/23   Fenton, Clint R, PA  lisinopril (ZESTRIL) 20 MG tablet Take 1 tablet (20 mg total) by mouth daily. 09/02/23 09/01/24  Fenton, Clint R, PA  metoprolol  succinate (TOPROL -XL) 25 MG 24 hr tablet Take 1 tablet (25 mg total) by mouth 2 (two) times daily. 09/02/23   Fenton, Clint R, PA  NEOMYCIN -POLYMYXIN-HYDROCORTISONE (CORTISPORIN) 1 % SOLN OTIC solution Place 1 drop into both ears daily as needed (itching). 11/20/21   Lucendia Rusk, MD  rivaroxaban  (XARELTO ) 20 MG TABS tablet TAKE ONE TABLET BY MOUTH ONE TIME DAILY WITH SUPPER 06/20/23   Fenton, Clint R, PA  triamcinolone cream (KENALOG) 0.5 % Apply 1 Application topically 2 (two) times daily. 02/07/23   [provider]  Physical Exam: BP (!) 138/96   Pulse (!) 109   Temp 97.8 F (36.6 C) (Oral)   Resp 18   SpO2 98%  General:  Alert, oriented, calm, in no acute distress, looks a little pale Cardiovascular: RRR, no murmurs or rubs, no peripheral edema  Respiratory: clear to auscultation bilaterally, no wheezes, no crackles  Abdomen: soft, nontender, nondistended, normal bowel tones heard  Skin: dry, no rashes  Musculoskeletal: no joint effusions, normal range of motion  Psychiatric: appropriate affect, normal speech  Neurologic: extraocular  muscles intact, clear speech, moving all extremities with intact sensorium         Labs on Admission:  Basic Metabolic Panel: Recent Labs  Lab 09/02/23 1008 09/03/23 1050  NA 140 134*  K 4.3 3.7  CL 102 101  CO2 20 25  GLUCOSE 97 112*  BUN 20 21  CREATININE 1.67* 1.59*  CALCIUM 9.3 9.0   Liver Function Tests: Recent Labs  Lab 09/03/23 1050  AST 18  ALT 11  ALKPHOS 70  BILITOT 0.8  PROT 7.7  ALBUMIN 3.9   No results for input(s): "LIPASE", "AMYLASE" in the last 168 hours. No results for input(s): "AMMONIA" in the last 168 hours. CBC: Recent Labs  Lab 09/02/23 1008 09/03/23 1050  WBC 7.7 7.1  NEUTROABS  --  4.2  HGB 6.6* 6.6*  HCT 24.5* 23.4*  MCV 68* 66.5*  PLT 437 428*   Cardiac Enzymes: No results for input(s): "CKTOTAL", "CKMB", "CKMBINDEX", "TROPONINI" in the last 168 hours. BNP (last 3 results) No results for input(s): "BNP" in the last 8760 hours.  ProBNP (last 3 results) No results for input(s): "PROBNP" in the last 8760 hours.  CBG: No results for input(s): "GLUCAP" in the last 168 hours.  Radiological Exams on Admission: CT ANGIO GI BLEED Result Date: 09/03/2023 CLINICAL DATA:  Hematochezia EXAM: CTA ABDOMEN AND PELVIS WITHOUT AND WITH CONTRAST TECHNIQUE: Multidetector CT imaging of the abdomen and pelvis was performed using the standard protocol during bolus administration of intravenous contrast. Multiplanar reconstructed images and MIPs were obtained and reviewed to evaluate the vascular anatomy. RADIATION DOSE REDUCTION: This exam was performed according to the departmental dose-optimization program which includes automated exposure control, adjustment of the mA and/or kV according to patient size and/or use of iterative reconstruction technique. CONTRAST:  80mL OMNIPAQUE IOHEXOL 350 MG/ML SOLN COMPARISON:  None Available. FINDINGS: VASCULAR No evidence of active arterial extravasation into the GI tract. No abdominal aortic aneurysm or significant  vascular stenosis. Review of the MIP images confirms the above findings. NON-VASCULAR Lower chest: No acute abnormality. Hepatobiliary: No focal liver abnormality is seen. No gallstones, gallbladder wall thickening, or biliary dilatation. Pancreas: Unremarkable. No pancreatic ductal dilatation or surrounding inflammatory changes. Spleen: Normal in size without focal abnormality. Adrenals/Urinary Tract: Nothing significant. No hydronephrosis or significant nephrolithiasis. Stomach/Bowel: Stomach is within normal limits. No evidence of appendicitis. No evidence of bowel wall thickening, distention, or inflammatory changes. Scattered colonic diverticulosis. Lymphatic: No significant vascular findings are present. No enlarged abdominal or pelvic lymph nodes. Reproductive: Prostate is unremarkable. Other: No abdominal wall hernia or abnormality. No abdominopelvic ascites. Musculoskeletal: No acute or significant osseous findings. IMPRESSION: 1. No evidence of active arterial extravasation into the GI tract. 2. Scattered colonic diverticulosis without evidence of diverticulitis. Electronically Signed   By: Reagan Camera M.D.   On: 09/03/2023 13:48   Assessment/Plan Daryl [name] is a 68 y.o. male with medical history significant for OSA, atrial fibrillation on Xarelto  being admitted  to the hospital lower GI bleeding and blood loss anemia.   Lower GI bleed resulting blood loss anemia-suspect this is hemorrhoidal bleeding, no active bleeding seen on CT scan today.  He is mildly hypotensive, but hemodynamically stable. -Observation admission -Avoid blood thinners -Transfuse 1 unit PRBC -Follow-up repeat CBC in the morning  Atrial fibrillation-continue Toprol  XL 25 p.o. twice daily, which is a recent reduction as well as flecainide .  Due to his bleeding, will hold Xarelto  for the time being.  Hypertension-hold lisinopril due to hypotension, continue Toprol -XL 25 mg p.o. twice daily.  He was previously taking  hydrochlorothiazide -lisinopril until his prescription was changed yesterday due to hypotension.  DVT prophylaxis: SCDs only     Code Status: Full Code  Consults called: None  Admission status: Observation   Time spent: 48 minutes  Trezure Cronk Rickey Charm MD [facility] Pager [phone]  If 7PM-7AM, please contact night-coverage [email or web address] Password [email or web address]  09/03/2023, 2:30 PM

## 2023-09-03 NOTE — ED Notes (Signed)
 Light green, lavender, and pink BB labeled and sent to lab.

## 2023-09-03 NOTE — ED Notes (Signed)
Called floor, no answer.

## 2023-09-03 NOTE — ED Notes (Signed)
 Sent off second type and screen, pending blood bank approval and unit to be ready

## 2023-09-03 NOTE — Progress Notes (Signed)
   09/03/23 2249  BiPAP/CPAP/SIPAP  $ Non-Invasive Home Ventilator  Initial  $ Face Mask Large  Yes  BiPAP/CPAP/SIPAP Pt Type Adult  BiPAP/CPAP/SIPAP Resmed  Mask Type Full face mask  Dentures removed? Not applicable  Mask Size Large  Respiratory Rate 18 breaths/min  Pressure Support 5 cmH20  FiO2 (%) 21 %  Patient Home Machine No  Patient Home Mask No  Patient Home Tubing No  Auto Titrate Yes  Minimum cmH2O 5 cmH2O  Maximum cmH2O 10 cmH2O  Nasal massage performed Yes  Device Plugged into RED Power Outlet Yes  BiPAP/CPAP /SiPAP Vitals  Pulse Rate 71  Resp 18  SpO2 92 %  Bilateral Breath Sounds Clear;Diminished  MEWS Score/Color  MEWS Score 0  MEWS Score Color Daryl [name]

## 2023-09-03 NOTE — ED Provider Notes (Signed)
 [place] EMERGENCY DEPARTMENT AT [facility] Provider Note   CSN: [id number] Arrival date & time: 09/03/23  1018     History Chief Complaint  Patient presents with   GI Bleeding    Pts wife translating, pt went to cardiologist yesterday hgb/hct 6.6/24.5 hx BP afib, chronic anemia, because of recent reduction in lisinopril feeling lightheaded, weak, and just "off." Pt has been having bright red rectal bleeding (hx of hemorrhoids) and has been recently started on xarelto . Pt is pale in triage, this week complaining of soboe this week when symptoms began. No prior colonscopy    HERB BELTRE is a 68 y.o. male with history of atrial fibrillation on Xarelto , hypertension presents emerged from today for evaluation of low hemoglobin.  Patient recently seen a cardiologist for follow-up.  Reports he is having some dyspnea on exertion so they question possible anemia and ordered lab work.  Was told that his hemoglobin was 6.6 and coming to the emergency room.  He reports that he has been having known hemorrhoids for years and occasionally have been bleeding.  Reports that they have been slightly worse for the past 2 weeks.  Has not had any pain with his belly upon defecation.  Reports that his rectum feels "inflamed" but does not have any pain with defecation.  Denies any black stool ever.  Reports that occasionally sometimes he will have a bowel movement double does have some blood but no actual stool.  Denies any dysuria or hematuria.  Denies any fevers.  He has never had a colonoscopy but usually does FIT testing.  Patient does speak English some and asks for his wife to translate.   HPI     Home Medications Prior to Admission medications   Medication Sig Start Date End Date Taking? Authorizing Provider  acetaminophen (TYLENOL) 500 MG tablet Take 500 mg by mouth as needed for mild pain (pain score 1-3) or moderate pain (pain score 4-6).    [provider]  flecainide   (TAMBOCOR ) 100 MG tablet TAKE ONE TABLET BY MOUTH TWICE DAILY 07/05/23   Fenton, Clint R, PA  lisinopril (ZESTRIL) 20 MG tablet Take 1 tablet (20 mg total) by mouth daily. 09/02/23 09/01/24  Fenton, Clint R, PA  metoprolol  succinate (TOPROL -XL) 25 MG 24 hr tablet Take 1 tablet (25 mg total) by mouth 2 (two) times daily. 09/02/23   Fenton, Clint R, PA  NEOMYCIN -POLYMYXIN-HYDROCORTISONE (CORTISPORIN) 1 % SOLN OTIC solution Place 1 drop into both ears daily as needed (itching). 11/20/21   Lucendia Rusk, MD  rivaroxaban  (XARELTO ) 20 MG TABS tablet TAKE ONE TABLET BY MOUTH ONE TIME DAILY WITH SUPPER 06/20/23   Fenton, Clint R, PA  triamcinolone cream (KENALOG) 0.5 % Apply 1 Application topically 2 (two) times daily. 02/07/23   [provider]      Allergies    Patient has no known allergies.    Review of Systems   Review of Systems  Constitutional:  Positive for fatigue. Negative for chills and fever.  Respiratory:  Negative for cough.   Cardiovascular:  Negative for chest pain.  Gastrointestinal:  Positive for anal bleeding and blood in stool. Negative for abdominal pain, constipation, diarrhea, nausea and vomiting.  Genitourinary:  Negative for dysuria and hematuria.    Physical Exam Updated Vital Signs BP (!) 194/88 (BP Location: Right Arm)   Pulse 88   Temp 98.2 F (36.8 C) (Oral)   Resp 16   SpO2 98%  Physical Exam Vitals and  nursing note reviewed. Exam conducted with a chaperone present Adell Hones, NT).  Constitutional:      General: He is not in acute distress.    Appearance: He is not ill-appearing or toxic-appearing.  HENT:     Mouth/Throat:     Mouth: Mucous membranes are moist.  Eyes:     General: No scleral icterus.    Comments: Pale conjunctiva  Cardiovascular:     Rate and Rhythm: Normal rate.  Pulmonary:     Effort: Pulmonary effort is normal. No respiratory distress.  Abdominal:     Palpations: Abdomen is soft.     Tenderness: There is no abdominal  tenderness. There is no guarding or rebound.  Genitourinary:    Comments: Very minimal amount of bright red blood seen on DRE. The patient does have tow large external hemorrhoids. Soft, not thrombosed. No melenic stool.  Skin:    General: Skin is warm and dry.  Neurological:     Mental Status: He is alert.     ED Results / Procedures / Treatments   Labs (all labs ordered are listed, but only abnormal results are displayed) Labs Reviewed  CBC WITH DIFFERENTIAL/PLATELET  COMPREHENSIVE METABOLIC PANEL WITH GFR  PROTIME-INR  TYPE AND SCREEN    EKG None  Radiology CT ANGIO GI BLEED Result Date: 09/03/2023 CLINICAL DATA:  Hematochezia EXAM: CTA ABDOMEN AND PELVIS WITHOUT AND WITH CONTRAST TECHNIQUE: Multidetector CT imaging of the abdomen and pelvis was performed using the standard protocol during bolus administration of intravenous contrast. Multiplanar reconstructed images and MIPs were obtained and reviewed to evaluate the vascular anatomy. RADIATION DOSE REDUCTION: This exam was performed according to the departmental dose-optimization program which includes automated exposure control, adjustment of the mA and/or kV according to patient size and/or use of iterative reconstruction technique. CONTRAST:  80mL OMNIPAQUE IOHEXOL 350 MG/ML SOLN COMPARISON:  None Available. FINDINGS: VASCULAR No evidence of active arterial extravasation into the GI tract. No abdominal aortic aneurysm or significant vascular stenosis. Review of the MIP images confirms the above findings. NON-VASCULAR Lower chest: No acute abnormality. Hepatobiliary: No focal liver abnormality is seen. No gallstones, gallbladder wall thickening, or biliary dilatation. Pancreas: Unremarkable. No pancreatic ductal dilatation or surrounding inflammatory changes. Spleen: Normal in size without focal abnormality. Adrenals/Urinary Tract: Nothing significant. No hydronephrosis or significant nephrolithiasis. Stomach/Bowel: Stomach is within  normal limits. No evidence of appendicitis. No evidence of bowel wall thickening, distention, or inflammatory changes. Scattered colonic diverticulosis. Lymphatic: No significant vascular findings are present. No enlarged abdominal or pelvic lymph nodes. Reproductive: Prostate is unremarkable. Other: No abdominal wall hernia or abnormality. No abdominopelvic ascites. Musculoskeletal: No acute or significant osseous findings. IMPRESSION: 1. No evidence of active arterial extravasation into the GI tract. 2. Scattered colonic diverticulosis without evidence of diverticulitis. Electronically Signed   By: Reagan Camera M.D.   On: 09/03/2023 13:48    Procedures Procedures   Medications Ordered in ED Medications - No data to display  ED Course/ Medical Decision Making/ A&P   Medical Decision Making Amount and/or Complexity of Data Reviewed Labs: ordered. Radiology: ordered.  Risk Prescription drug management. Decision regarding hospitalization.   68 y.o. male presents to the ER for evaluation of low hemoglobin. Differential diagnosis includes but is not limited to lab error, anemia of chronic disease, GI bleed. Vital signs unremarkable. Physical exam as noted above.   Patient has a small amount of blood on DRE, likely from internal hemorrhoids but patient does report that he has had had some  bowel movements without stool but with blood.  Assessment and will order CT angio GI bleed.  Labs ordered as well as type and screen  I independently reviewed and interpreted the patient's labs.  CMP shows sodium 134, glucose 112 with creatinine 1.59 at baseline.  No other electrolyte or LFT normality.  PT and INR slightly elevated likely in setting of Xarelto  use.  CBC does show anemia with a hemoglobin of 6.6 which was seen previously with cardiology.  No leukocytosis.  Platelets slightly elevated at  428.  Type and screen ordered.   Discussed blood transfusion with patient and wife at bedside who agreed for  transfusion.  1 unit ordered.  CT angio shows 1. No evidence of active arterial extravasation into the GI tract. 2. Scattered colonic diverticulosis without evidence of diverticulitis.  Patient likely having some internal hemorrhoids however will need further workup and consultation.  Discussed this with patient and wife at bedside who agreed to admission.  Dr. Lowell Rude to admit.  Portions of this report may have been transcribed using voice recognition software. Every effort was made to ensure accuracy; however, inadvertent computerized transcription errors may be present.   Final Clinical Impression(s) / ED Diagnoses Final diagnoses:  Anemia, unspecified type  Lower GI bleed    Rx / DC Orders ED Discharge Orders     None         Spence Dux, PA-C 09/03/23 2203    Iva Mariner, MD 09/04/23 1651

## 2023-09-04 ENCOUNTER — Observation Stay (HOSPITAL_COMMUNITY): Payer: Medicare (Managed Care)

## 2023-09-04 DIAGNOSIS — I480 Paroxysmal atrial fibrillation: Secondary | ICD-10-CM

## 2023-09-04 DIAGNOSIS — K5790 Diverticulosis of intestine, part unspecified, without perforation or abscess without bleeding: Secondary | ICD-10-CM | POA: Insufficient documentation

## 2023-09-04 DIAGNOSIS — I10 Essential (primary) hypertension: Secondary | ICD-10-CM | POA: Diagnosis not present

## 2023-09-04 DIAGNOSIS — N179 Acute kidney failure, unspecified: Secondary | ICD-10-CM

## 2023-09-04 DIAGNOSIS — D500 Iron deficiency anemia secondary to blood loss (chronic): Secondary | ICD-10-CM | POA: Diagnosis not present

## 2023-09-04 DIAGNOSIS — I4891 Unspecified atrial fibrillation: Secondary | ICD-10-CM

## 2023-09-04 DIAGNOSIS — D509 Iron deficiency anemia, unspecified: Secondary | ICD-10-CM | POA: Insufficient documentation

## 2023-09-04 LAB — ECHOCARDIOGRAM COMPLETE
AR max vel: 2.77 cm2
AV Area VTI: 2.85 cm2
AV Area mean vel: 2.63 cm2
AV Mean grad: 5 mmHg
AV Peak grad: 9.9 mmHg
Ao pk vel: 1.57 m/s
Area-P 1/2: 3.99 cm2
Calc EF: 60.2 %
Height: 69 in
MV M vel: 5.19 m/s
MV Peak grad: 107.7 mmHg
S' Lateral: 2.3 cm
Single Plane A2C EF: 54.1 %
Single Plane A4C EF: 64.5 %
Weight: 4470.22 [oz_av]

## 2023-09-04 LAB — CBC
HCT: 24.4 % — ABNORMAL LOW (ref 39.0–52.0)
Hemoglobin: 7.1 g/dL — ABNORMAL LOW (ref 13.0–17.0)
MCH: 20 pg — ABNORMAL LOW (ref 26.0–34.0)
MCHC: 29.1 g/dL — ABNORMAL LOW (ref 30.0–36.0)
MCV: 68.7 fL — ABNORMAL LOW (ref 80.0–100.0)
Platelets: 342 10*3/uL (ref 150–400)
RBC: 3.55 MIL/uL — ABNORMAL LOW (ref 4.22–5.81)
RDW: 19.6 % — ABNORMAL HIGH (ref 11.5–15.5)
WBC: 5.7 10*3/uL (ref 4.0–10.5)
nRBC: 0 % (ref 0.0–0.2)

## 2023-09-04 LAB — BASIC METABOLIC PANEL WITH GFR
Anion gap: 9 (ref 5–15)
BUN: 16 mg/dL (ref 8–23)
CO2: 25 mmol/L (ref 22–32)
Calcium: 8.7 mg/dL — ABNORMAL LOW (ref 8.9–10.3)
Chloride: 102 mmol/L (ref 98–111)
Creatinine, Ser: 1.24 mg/dL (ref 0.61–1.24)
GFR, Estimated: >60 mL/min (ref >60)
Glucose, Bld: 98 mg/dL (ref 70–99)
Potassium: 3.9 mmol/L (ref 3.5–5.1)
Sodium: 136 mmol/L (ref 135–145)

## 2023-09-04 LAB — RETICULOCYTES
Immature Retic Fract: 35.5 % — ABNORMAL HIGH (ref 2.3–15.9)
RBC.: 3.57 MIL/uL — ABNORMAL LOW (ref 4.22–5.81)
Retic Count, Absolute: 48.9 10*3/uL (ref 19.0–186.0)
Retic Ct Pct: 1.4 % (ref 0.4–3.1)

## 2023-09-04 LAB — IRON AND TIBC
Iron: 15 ug/dL — ABNORMAL LOW (ref 45–182)
Saturation Ratios: 3 % — ABNORMAL LOW (ref 17.9–39.5)
TIBC: 573 ug/dL — ABNORMAL HIGH (ref 250–450)
UIBC: 558 ug/dL

## 2023-09-04 LAB — FERRITIN: Ferritin: 3 ng/mL — ABNORMAL LOW (ref 24–336)

## 2023-09-04 LAB — HIV ANTIBODY (ROUTINE TESTING W REFLEX): HIV Screen 4th Generation wRfx: NONREACTIVE

## 2023-09-04 LAB — FOLATE: Folate: 12.8 ng/mL (ref >5.9)

## 2023-09-04 LAB — VITAMIN B12: Vitamin B-12: 554 pg/mL (ref 180–914)

## 2023-09-04 MED ORDER — FLUTICASONE PROPIONATE 50 MCG/ACT NA SUSP
2.0000 | Freq: Every day | NASAL | Status: DC
  Administered 2023-09-04 – 2023-09-08 (×5): 2 via NASAL
  Filled 2023-09-04: qty 16

## 2023-09-04 MED ORDER — NEOMYCIN-POLYMYXIN-HC 1 % OT SOLN
1.0000 [drp] | Freq: Three times a day (TID) | OTIC | Status: DC
  Administered 2023-09-04 – 2023-09-06 (×7): 1 [drp] via OTIC
  Filled 2023-09-04: qty 10

## 2023-09-04 MED ORDER — IRON SUCROSE 400 MG IVPB - SIMPLE MED
400.0000 mg | Freq: Once | Status: AC
  Administered 2023-09-04: 400 mg via INTRAVENOUS
  Filled 2023-09-04: qty 400

## 2023-09-04 MED ORDER — PANTOPRAZOLE SODIUM 40 MG PO TBEC
40.0000 mg | DELAYED_RELEASE_TABLET | Freq: Two times a day (BID) | ORAL | Status: DC
  Administered 2023-09-04 – 2023-09-08 (×7): 40 mg via ORAL
  Filled 2023-09-04 (×7): qty 1

## 2023-09-04 MED ORDER — MELATONIN 5 MG PO TABS
5.0000 mg | ORAL_TABLET | Freq: Every evening | ORAL | Status: DC | PRN
  Administered 2023-09-04 – 2023-09-06 (×2): 5 mg via ORAL
  Filled 2023-09-04 (×2): qty 1

## 2023-09-04 MED ORDER — LORATADINE 10 MG PO TABS
10.0000 mg | ORAL_TABLET | Freq: Every day | ORAL | Status: DC
  Administered 2023-09-04 – 2023-09-08 (×5): 10 mg via ORAL
  Filled 2023-09-04 (×5): qty 1

## 2023-09-04 NOTE — Care Management Obs Status (Signed)
 MEDICARE OBSERVATION STATUS NOTIFICATION   Patient Details  Name: Daryl [name] MRN: [id number] Date of Birth: 02-Jun-1955   Medicare Observation Status Notification Given:  Yes    Levie Ream, RN 09/04/2023, 2:46 PM

## 2023-09-04 NOTE — H&P (View-Only) (Signed)
 [facility] Consultation Note  Referring Provider: [facility] Primary Care Physician:  Pcp, No Primary Gastroenterologist:  None  Reason for Consultation:  anemia, hematochezia  HPI: Daryl [name] is a 68 y.o. male whom we were asked to see for above reasons.  [employer] Spanish interpretor used for translation.  Patient has anemia and hematochezia.  Patient reports prior intermittent hematochezia for 3-4 years.  Has chronic GERD.  No dysphagia, nausea, vomiting, constipation/straining, diarrhea, weight loss.  Cologuard negative couple years ago.  No prior endoscopy or colonoscopy.  No known family history of colon cancer or polyps.  History of atrial fibrillation with prior cardioversion,    Past Medical History:  Diagnosis Date   A-fib (HCC)    Heartburn    HTN (hypertension)    OSA (obstructive sleep apnea)    Osteoarthritis    knee   Overweight    Sleep apnea    SOBOE (shortness of breath on exertion)     Past Surgical History:  Procedure Laterality Date   CARDIOVERSION N/A 07/14/2020   Procedure: CARDIOVERSION;  Surgeon: Jann Melody, MD;  Location: [facility];  Service: Cardiovascular;  Laterality: N/A;    Prior to Admission medications   Medication Sig Start Date End Date Taking? Authorizing Provider  acetaminophen  (TYLENOL ) 500 MG tablet Take 1,000 mg by mouth as needed for mild pain (pain score 1-3), moderate pain (pain score 4-6) or headache.   Yes [provider]  AFRIN 12 HOUR 0.05 % nasal spray Place 1 spray into both nostrils 2 (two) times daily as needed for congestion.   Yes [provider]  famotidine (PEPCID) 20 MG tablet Take 20 mg by mouth at bedtime as needed for heartburn or indigestion.   Yes [provider]  flecainide  (TAMBOCOR ) 100 MG tablet TAKE ONE TABLET BY MOUTH TWICE DAILY 07/05/23  Yes Fenton, Clint R, PA  metoprolol  succinate (TOPROL -XL) 25 MG 24 hr tablet Take 1 tablet (25 mg total) by mouth 2  (two) times daily. 09/02/23  Yes Fenton, Clint R, PA  PRESCRIPTION MEDICATION See admin instructions. BiPAP- At bedtime   Yes [provider]  rivaroxaban  (XARELTO ) 20 MG TABS tablet TAKE ONE TABLET BY MOUTH ONE TIME DAILY WITH SUPPER Patient taking differently: Take 20 mg by mouth daily after supper. 06/20/23  Yes Fenton, Clint R, PA  sodium chloride  (OCEAN) 0.65 % SOLN nasal spray Place 1 spray into both nostrils as needed for congestion.   Yes [provider]  triamcinolone cream (KENALOG) 0.5 % Apply 1 Application topically 2 (two) times daily as needed (for itching). 02/07/23  Yes [provider]  lisinopril  (ZESTRIL ) 20 MG tablet Take 1 tablet (20 mg total) by mouth daily. Patient not taking: Reported on 09/03/2023 09/02/23 09/01/24  Fenton, Clint R, PA  NEOMYCIN -POLYMYXIN-HYDROCORTISONE (CORTISPORIN ) 1 % SOLN OTIC solution Place 1 drop into both ears daily as needed (itching). Patient not taking: Reported on 09/03/2023 11/20/21   Lucendia Rusk, MD    Current Facility-Administered Medications  Medication Dose Route Frequency Provider Last Rate Last Admin   acetaminophen  (TYLENOL ) tablet 650 mg  650 mg Oral Q6H PRN Gaylin Ke, MD   650 mg at 09/04/23 1308   Or   acetaminophen  (TYLENOL ) suppository 650 mg  650 mg Rectal Q6H PRN Jannette Mend, Mir M, MD       albuterol  (PROVENTIL ) (2.5 MG/3ML) 0.083% nebulizer solution 2.5 mg  2.5 mg Nebulization Q2H PRN Gaylin Ke, MD  flecainide  (TAMBOCOR ) tablet 100 mg  100 mg Oral BID Jannette Mend, Mir M, MD   100 mg at 09/04/23 1610   iron sucrose (VENOFER) 400 mg in sodium chloride  0.9 % 250 mL IVPB  400 mg Intravenous Once Gonfa, Taye T, MD       melatonin tablet 5 mg  5 mg Oral QHS PRN Daniels, James K, NP   5 mg at 09/04/23 0033   metoprolol  succinate (TOPROL -XL) 24 hr tablet 25 mg  25 mg Oral BID Jannette Mend, Mir M, MD   25 mg at 09/04/23 9604   NEOMYCIN -POLYMYXIN-HYDROCORTISONE (CORTISPORIN) OTIC (EAR)  solution 1 drop  1 drop Both EARS Q8H Gonfa, Taye T, MD   1 drop at 09/04/23 1021   ondansetron (ZOFRAN) tablet 4 mg  4 mg Oral Q6H PRN Jannette Mend, Mir M, MD       Or   ondansetron (ZOFRAN) injection 4 mg  4 mg Intravenous Q6H PRN Jannette Mend, Mir M, MD       pneumococcal 20-valent conjugate vaccine (PREVNAR 20) injection 0.5 mL  0.5 mL Intramuscular Tomorrow-1000 Jannette Mend, Mir M, MD        Allergies as of 09/03/2023   (No Known Allergies)    Family History  Problem Relation Age of Onset   Hypertension Mother    Obesity Mother    AAA (abdominal aortic aneurysm) Mother    Heart disease Mother    AAA (abdominal aortic aneurysm) Brother     Social History   Socioeconomic History   Marital status: Married    Spouse name: Si Jachim   Number of children: 2   Years of education: Not on file   Highest education level: Not on file  Occupational History   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, technician  Tobacco Use   Smoking status: Never   Smokeless tobacco: Never   Tobacco comments:    Never smoke 06/29/21  Vaping Use   Vaping status: Never Used  Substance and Sexual Activity   Alcohol use: Not Currently    Alcohol/week: 1.0 - 2.0 standard drink of alcohol    Types: 1 - 2 Cans of beer per week    Comment: 1 non alcholic beer   Drug use: Never   Sexual activity: Not on file  Other Topics Concern   Not on file  Social History Narrative   Not on file   Social Drivers of Health   Financial Resource Strain: Not on file  Food Insecurity: No Food Insecurity (09/03/2023)   Hunger Vital Sign    Worried About Running Out of Food in the Last Year: Never true    Ran Out of Food in the Last Year: Never true  Transportation Needs: No Transportation Needs (09/03/2023)   PRAPARE - Administrator, Civil Service (Medical): No    Lack of Transportation (Non-Medical): No  Physical Activity: Not on file  Stress: Not on file  Social  Connections: Socially Integrated (09/03/2023)   Social Connection and Isolation Panel [NHANES]    Frequency of Communication with Friends and Family: More than three times a week    Frequency of Social Gatherings with Friends and Family: Three times a week    Attends Religious Services: More than 4 times per year    Active Member of Clubs or Organizations: Yes    Attends Banker Meetings: More than 4 times per year    Marital Status: Married  Catering manager Violence: Not At  Risk (09/03/2023)   Humiliation, Afraid, Rape, and Kick questionnaire    Fear of Current or Ex-Partner: No    Emotionally Abused: No    Physically Abused: No    Sexually Abused: No    Review of Systems: As per HPI, all others negative  Physical Exam: Vital signs in last 24 hours: Temp:  [98.1 F (36.7 C)-98.5 F (36.9 C)] 98.5 F (36.9 C) (05/25 0619) Pulse Rate:  [61-78] 61 (05/25 0909) Resp:  [16-20] 20 (05/24 2354) BP: (119-138)/(61-76) 138/76 (05/25 0909) SpO2:  [92 %-100 %] 95 % (05/25 0619) FiO2 (%):  [21 %] 21 % (05/24 2249) Weight:  [126.7 kg] 126.7 kg (05/25 0051) Last BM Date : 09/04/23 (very small BM; drops of blood) General:   Alert,  overweight, well-nourished, pleasant and cooperative in NAD Head:  Normocephalic and atraumatic. Eyes:  Sclera clear, no icterus.   Conjunctiva pale Ears:  Normal auditory acuity. Nose:  No deformity, discharge,  or lesions. Mouth:  No deformity or lesions.  Oropharynx pale and dry Neck:  Supple; no masses or thyromegaly. Lungs:  No visible distress Abdomen:  Soft, nontender and nondistended. No masses, hepatosplenomegaly or hernias noted. Without guarding, and without rebound.     Msk:  Symmetrical without gross deformities. Normal posture. Pulses:  Normal pulses noted. Extremities:  Without clubbing or edema. Neurologic:  Alert and  oriented x4;  grossly normal neurologically. Skin:  Intact without significant lesions or rashes. Psych:  Alert  and cooperative. Normal mood and affect.   Lab Results: Recent Labs    09/02/23 1008 09/03/23 1050 09/04/23 0618  WBC 7.7 7.1 5.7  HGB 6.6* 6.6* 7.1*  HCT 24.5* 23.4* 24.4*  PLT 437 428* 342   BMET Recent Labs    09/02/23 1008 09/03/23 1050 09/04/23 0618  NA 140 134* 136  K 4.3 3.7 3.9  CL 102 101 102  CO2 20 25 25   GLUCOSE 97 112* 98  BUN 20 21 16   CREATININE 1.67* 1.59* 1.24  CALCIUM 9.3 9.0 8.7*   LFT Recent Labs    09/03/23 1050  PROT 7.7  ALBUMIN 3.9  AST 18  ALT 11  ALKPHOS 70  BILITOT 0.8   PT/INR Recent Labs    09/03/23 1050  LABPROT 21.6*  INR 1.9*    Studies/Results: CT ANGIO GI BLEED Result Date: 09/03/2023 CLINICAL DATA:  Hematochezia EXAM: CTA ABDOMEN AND PELVIS WITHOUT AND WITH CONTRAST TECHNIQUE: Multidetector CT imaging of the abdomen and pelvis was performed using the standard protocol during bolus administration of intravenous contrast. Multiplanar reconstructed images and MIPs were obtained and reviewed to evaluate the vascular anatomy. RADIATION DOSE REDUCTION: This exam was performed according to the departmental dose-optimization program which includes automated exposure control, adjustment of the mA and/or kV according to patient size and/or use of iterative reconstruction technique. CONTRAST:  80mL OMNIPAQUE IOHEXOL 350 MG/ML SOLN COMPARISON:  None Available. FINDINGS: VASCULAR No evidence of active arterial extravasation into the GI tract. No abdominal aortic aneurysm or significant vascular stenosis. Review of the MIP images confirms the above findings. NON-VASCULAR Lower chest: No acute abnormality. Hepatobiliary: No focal liver abnormality is seen. No gallstones, gallbladder wall thickening, or biliary dilatation. Pancreas: Unremarkable. No pancreatic ductal dilatation or surrounding inflammatory changes. Spleen: Normal in size without focal abnormality. Adrenals/Urinary Tract: Nothing significant. No hydronephrosis or significant  nephrolithiasis. Stomach/Bowel: Stomach is within normal limits. No evidence of appendicitis. No evidence of bowel wall thickening, distention, or inflammatory changes. Scattered colonic diverticulosis. Lymphatic: No  significant vascular findings are present. No enlarged abdominal or pelvic lymph nodes. Reproductive: Prostate is unremarkable. Other: No abdominal wall hernia or abnormality. No abdominopelvic ascites. Musculoskeletal: No acute or significant osseous findings. IMPRESSION: 1. No evidence of active arterial extravasation into the GI tract. 2. Scattered colonic diverticulosis without evidence of diverticulitis. Electronically Signed   By: Reagan Camera M.D.   On: 09/03/2023 13:48    Impression:   Acute on chronic hematochezia.  Patient reports this has been ongoing intermittently for past 3-4 years.  No prior colonoscopy.  Hemorrhoids, diverticulosis, other? Acute blood loss anemia.  Is iron deficient, so suspect chronic component as well. GERD. Chronic anticoagulation, rivaroxaban . Atrial fibrillation with prior cardioversion.  Plan:   Rivaroxaban  on hold (last dose < 24 hours ago). Follow CBC and transfuse as needed. PPI. Endoscopy and colonoscopy Tuesday 09/06/23. Risks (bleeding, infection, bowel perforation that could require surgery, sedation-related changes in cardiopulmonary systems), benefits (identification and possible treatment of source of symptoms, exclusion of certain causes of symptoms), and alternatives (watchful waiting, radiographic imaging studies, empiric medical treatment) of upper endoscopy (EGD) were explained to patient/family in detail and patient wishes to proceed. Risks (bleeding, infection, bowel perforation that could require surgery, sedation-related changes in cardiopulmonary systems), benefits (identification and possible treatment of source of symptoms, exclusion of certain causes of symptoms), and alternatives (watchful waiting, radiographic imaging  studies, empiric medical treatment) of colonoscopy were explained to patient/family in detail and patient wishes to proceed.  [facility] will follow.   LOS: 0 days   Keven Osborn M  09/04/2023, 1:50 PM  Cell [phone] If no answer or after 5 PM call [phone]

## 2023-09-04 NOTE — Consult Note (Addendum)
 Cardiology Consultation   Patient ID: Daryl [name] MRN: [id number]; DOB: 1955-11-14  Admit date: 09/03/2023 Date of Consult: 09/04/2023  PCP:  Juvenal Opoka   [facility] Providers Cardiologist:  Avery Bodo, MD        Patient Profile:   Daryl [name] is a 68 y.o. male with a hx of hypertension, OSA, atrial fibrillation who is being seen 09/04/2023 for the evaluation of atrial fibrillation at the request of Dr Mae Schlossman.  History of Present Illness:   Daryl [name] is a 67-year male with history of hypertension, OSA, atrial fibrillation who we are consulted by Dr. Gonfa for evaluation of atrial fibrillation.  He was diagnosed with A-fib in 2022 after presenting to ED with chest pain.  Found to be in A-fib with RVR, underwent DCCV at that time.  He was started on anticoagulation.  He had to undergo repeat DCCV again in 2022 and was started on flecainide .  He was seen in A-fib clinic on 09/02/2023, noted to have soft BP and reported having hematochezia.  He is currently on Xarelto  20 mg daily, Toprol -XL 25 mg twice daily, and flecainide  100 mg twice daily.  Labs were checked and he was found have a hemoglobin of 6.6, down from 11.6 in 09/2022.  He was recommended to go to the ED.  In ED, initial vital signs notable for 194/88, pulse 88, SpO2 98% on room air.  Vitals have improved, most recent BP 138/76, pulse 61.  Initial labs notable for creatinine 1.67 (has improved to 1.24 today), hemoglobin 6.6.  He received 1 unit PRBCs and hemoglobin 7.1 today.  He denies any chest pain but does report has been short of breath.   Past Medical History:  Diagnosis Date   A-fib (HCC)    Heartburn    HTN (hypertension)    OSA (obstructive sleep apnea)    Osteoarthritis    knee   Overweight    Sleep apnea    SOBOE (shortness of breath on exertion)     Past Surgical History:  Procedure Laterality Date   CARDIOVERSION N/A 07/14/2020   Procedure: CARDIOVERSION;  Surgeon: Jann Melody,  MD;  Location: [facility];  Service: Cardiovascular;  Laterality: N/A;      Inpatient Medications: Scheduled Meds:  flecainide   100 mg Oral BID   metoprolol  succinate  25 mg Oral BID   NEOMYCIN -POLYMYXIN-HYDROCORTISONE  1 drop Both EARS Q8H   pneumococcal 20-valent conjugate vaccine  0.5 mL Intramuscular Tomorrow-1000   Continuous Infusions:  PRN Meds: acetaminophen **OR** acetaminophen, albuterol, melatonin, ondansetron **OR** ondansetron (ZOFRAN) IV  Allergies:   No Known Allergies  Social History:   Social History   Socioeconomic History   Marital status: Married    Spouse name: Kyion Gautier   Number of children: 2   Years of education: Not on file   Highest education level: Not on file  Occupational History   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, technician  Tobacco Use   Smoking status: Never   Smokeless tobacco: Never   Tobacco comments:    Never smoke 06/29/21  Vaping Use   Vaping status: Never Used  Substance and Sexual Activity   Alcohol use: Not Currently    Alcohol/week: 1.0 - 2.0 standard drink of alcohol    Types: 1 - 2 Cans of beer per week    Comment: 1 non alcholic beer   Drug use: Never   Sexual activity: Not on file  Other Topics Concern   Not on file  Social History Narrative   Not on file   Social Drivers of Health   Financial Resource Strain: Not on file  Food Insecurity: No Food Insecurity (09/03/2023)   Hunger Vital Sign    Worried About Running Out of Food in the Last Year: Never true    Ran Out of Food in the Last Year: Never true  Transportation Needs: No Transportation Needs (09/03/2023)   PRAPARE - Administrator, Civil Service (Medical): No    Lack of Transportation (Non-Medical): No  Physical Activity: Not on file  Stress: Not on file  Social Connections: Socially Integrated (09/03/2023)   Social Connection and Isolation Panel [NHANES]    Frequency of Communication with Friends and  Family: More than three times a week    Frequency of Social Gatherings with Friends and Family: Three times a week    Attends Religious Services: More than 4 times per year    Active Member of Clubs or Organizations: Yes    Attends Banker Meetings: More than 4 times per year    Marital Status: Married  Catering manager Violence: Not At Risk (09/03/2023)   Humiliation, Afraid, Rape, and Kick questionnaire    Fear of Current or Ex-Partner: No    Emotionally Abused: No    Physically Abused: No    Sexually Abused: No    Family History:    Family History  Problem Relation Age of Onset   Hypertension Mother    Obesity Mother    AAA (abdominal aortic aneurysm) Mother    Heart disease Mother    AAA (abdominal aortic aneurysm) Brother      ROS:  Please see the history of present illness.   All other ROS reviewed and negative.     Physical Exam/Data:   Vitals:   09/03/23 2354 09/04/23 0051 09/04/23 0619 09/04/23 0909  BP: 126/61  129/74 138/76  Pulse: 68  64 61  Resp: 20     Temp: 98.3 F (36.8 C)  98.5 F (36.9 C)   TempSrc:   Oral   SpO2: 96%  95%   Weight:  126.7 kg    Height:  5\' 9"  (1.753 m)      Intake/Output Summary (Last 24 hours) at 09/04/2023 0923 Last data filed at 09/04/2023 0400 Gross per 24 hour  Intake 604 ml  Output --  Net 604 ml      09/04/2023   12:51 AM 09/02/2023    8:53 AM 05/26/2023    3:06 PM  Last 3 Weights  Weight (lbs) 279 lb 6.2 oz 277 lb 3.2 oz 277 lb 9.6 oz  Weight (kg) 126.73 kg 125.737 kg 125.919 kg     Body mass index is 41.26 kg/m.  General: in no acute distress HEENT: normal Neck: no JVD Cardiac:  normal S1, S2; RRR; no murmur  Lungs:  clear to auscultation bilaterally, no wheezing, rhonchi or rales  Abd: soft, nontender, no hepatomegaly  Ext: no edema Musculoskeletal:  No deformities, BUE and BLE strength normal and equal Skin: warm and dry  Neuro:  CNs 2-12 intact, no focal abnormalities noted Psych:  Normal  affect   EKG:  The EKG was personally reviewed and demonstrates: Normal sinus rhythm, rate 91 Telemetry:  Telemetry was personally reviewed and demonstrates: Telemetry  Relevant CV Studies:   Laboratory Data:  High Sensitivity Troponin:  No results for input(s): "TROPONINIHS" in the last 720 hours.  Chemistry Recent Labs  Lab 09/02/23 1008 09/03/23 1050 09/04/23 0618  NA 140 134* 136  K 4.3 3.7 3.9  CL 102 101 102  CO2 20 25 25   GLUCOSE 97 112* 98  BUN 20 21 16   CREATININE 1.67* 1.59* 1.24  CALCIUM 9.3 9.0 8.7*  GFRNONAA  --  47* >60  ANIONGAP  --  8 9    Recent Labs  Lab 09/03/23 1050  PROT 7.7  ALBUMIN 3.9  AST 18  ALT 11  ALKPHOS 70  BILITOT 0.8   Lipids No results for input(s): "CHOL", "TRIG", "HDL", "LABVLDL", "LDLCALC", "CHOLHDL" in the last 168 hours.  Hematology Recent Labs  Lab 09/02/23 1008 09/03/23 1050 09/04/23 0618 09/04/23 0816  WBC 7.7 7.1 5.7  --   RBC 3.63* 3.52* 3.55* 3.57*  HGB 6.6* 6.6* 7.1*  --   HCT 24.5* 23.4* 24.4*  --   MCV 68* 66.5* 68.7*  --   MCH 18.2* 18.8* 20.0*  --   MCHC 26.9* 28.2* 29.1*  --   RDW 16.2* 17.4* 19.6*  --   PLT 437 428* 342  --    Thyroid  No results for input(s): "TSH", "FREET4" in the last 168 hours.  BNPNo results for input(s): "BNP", "PROBNP" in the last 168 hours.  DDimer No results for input(s): "DDIMER" in the last 168 hours.   Radiology/Studies:  CT ANGIO GI BLEED Result Date: 09/03/2023 CLINICAL DATA:  Hematochezia EXAM: CTA ABDOMEN AND PELVIS WITHOUT AND WITH CONTRAST TECHNIQUE: Multidetector CT imaging of the abdomen and pelvis was performed using the standard protocol during bolus administration of intravenous contrast. Multiplanar reconstructed images and MIPs were obtained and reviewed to evaluate the vascular anatomy. RADIATION DOSE REDUCTION: This exam was performed according to the departmental dose-optimization program which includes automated exposure control, adjustment of the mA and/or  kV according to patient size and/or use of iterative reconstruction technique. CONTRAST:  80mL OMNIPAQUE IOHEXOL 350 MG/ML SOLN COMPARISON:  None Available. FINDINGS: VASCULAR No evidence of active arterial extravasation into the GI tract. No abdominal aortic aneurysm or significant vascular stenosis. Review of the MIP images confirms the above findings. NON-VASCULAR Lower chest: No acute abnormality. Hepatobiliary: No focal liver abnormality is seen. No gallstones, gallbladder wall thickening, or biliary dilatation. Pancreas: Unremarkable. No pancreatic ductal dilatation or surrounding inflammatory changes. Spleen: Normal in size without focal abnormality. Adrenals/Urinary Tract: Nothing significant. No hydronephrosis or significant nephrolithiasis. Stomach/Bowel: Stomach is within normal limits. No evidence of appendicitis. No evidence of bowel wall thickening, distention, or inflammatory changes. Scattered colonic diverticulosis. Lymphatic: No significant vascular findings are present. No enlarged abdominal or pelvic lymph nodes. Reproductive: Prostate is unremarkable. Other: No abdominal wall hernia or abnormality. No abdominopelvic ascites. Musculoskeletal: No acute or significant osseous findings. IMPRESSION: 1. No evidence of active arterial extravasation into the GI tract. 2. Scattered colonic diverticulosis without evidence of diverticulitis. Electronically Signed   By: Reagan Camera M.D.   On: 09/03/2023 13:48     Assessment and Plan:   Paroxysmal atrial fibrillation: CHA2DS2-VASc 2 (age, hypertension).  He has been on Xarelto .  Maintaining sinus rhythm on flecainide . - Continue flecainide  100 mg twice daily.  Continue Toprol -XL 25 mg twice daily - Hold Xarelto  in setting of GI bleed.  GI consulted - Would put on telemetry - Will update echocardiogram  GI bleed: Presented with hemoglobin 6.6, reports hematochezia.  Holding Xarelto .  GI consulted  Hypertension: Has been on Toprol -XL and  lisinopril-HCTZ at home.  Had AKI on admission, lisinopril-HCTZ  held.  Currently appears normotensive just on Toprol -XL  AKI: Creatinine 1.59 on presentation.  Lisinopril-HCTZ held, creatinine improved to 1.24 today  For questions or updates, please contact [facility] Please consult [email or web address] for contact info under    Signed, Wendie Hamburg, MD  09/04/2023 9:23 AM

## 2023-09-04 NOTE — Progress Notes (Signed)
*  PRELIMINARY RESULTS* Echocardiogram 2D Echocardiogram has been performed.  Daryl [name] 09/04/2023, 1:32 PM

## 2023-09-04 NOTE — Progress Notes (Signed)
   09/04/23 2342  BiPAP/CPAP/SIPAP  Reason BIPAP/CPAP not in use Other(comment) (PATIENT IS NOT READY TO PUT ON AT THIS TIME. HE WILL LET NURSE KNOW WHEN OR IF HE WANTS TO PUT THE CPAP ON TONIGHT.)  BiPAP/CPAP /SiPAP Vitals  Resp 18  MEWS Score/Color  MEWS Score 0  MEWS Score Color Marrie Sizer

## 2023-09-04 NOTE — Progress Notes (Signed)
 PROGRESS NOTE  Daryl [name] YQM:[id number] DOB: [birth year]   PCP: Pcp, No  Patient is from: Home.  Independently ambulates at baseline.  DOA: 09/03/2023 LOS: 0  Chief complaints Chief Complaint  Patient presents with   GI Bleeding    Pts wife translating, pt went to cardiologist yesterday hgb/hct 6.6/24.5 hx BP afib, chronic anemia, because of recent reduction in lisinopril feeling lightheaded, weak, and just "off." Pt has been having bright red rectal bleeding (hx of hemorrhoids) and has been recently started on xarelto . Pt is pale in triage, this week complaining of soboe this week when symptoms began. No prior colonscopy     Brief Narrative / Interim history: 68 year old M with PMH of OSA on CPAP, A-fib on flecainide  and Xarelto , hemorrhoids and morbid obesity sent to ED from cardiology office due to hypotension and anemia after he complained dizziness and bright red blood per rectum. Patient has had intermittent bright red blood per rectum that he attributes to hemorrhoids that has gotten worse with Xarelto  and suppositories for constipation.  His hemoglobin of 6.6 (from 11.6 in 09/2022).   In ED, BP elevated to 190/88 but improved quickly.  Hgb 6.6.  MCV 66.7. Cr 1.59.  BUN 21.  CT abdomen and pelvis without extravasation but diverticulosis without evidence of diverticulitis.  1 unit of blood ordered.  Admission requested for symptomatic anemia due to lower GI bleed.  The next day, Hgb improved to 7.1.  Felt well.  GI and cardiology consulted.  Plan for EGD and colonoscopy.  Xarelto  remains on hold.  Subjective: Seen and examined earlier this morning with the help of video interpreter with ID number [id number].  Reports feeling better after blood transfusion.  No longer dizzy or tired.  Still with some drops of blood in commode when he had a bowel movement but not as much as when he came in.  Denies a gush.  Denies blood clots.  Denies chest pain, palpitation or shortness of breath.  Denies  NSAID use.  Objective: Vitals:   09/03/23 2354 09/04/23 0051 09/04/23 0619 09/04/23 0909  BP: 126/61  129/74 138/76  Pulse: 68  64 61  Resp: 20     Temp: 98.3 F (36.8 C)  98.5 F (36.9 C)   TempSrc:   Oral   SpO2: 96%  95%   Weight:  126.7 kg    Height:  5\' 9"  (1.753 m)      Examination:  GENERAL: No apparent distress.  Nontoxic. HEENT: MMM.  Vision and hearing grossly intact.  NECK: Supple.  No apparent JVD.  RESP:  No IWOB.  Fair aeration bilaterally. CVS:  RRR. Heart sounds normal.  ABD/GI/GU: BS+. Abd soft, NTND.  MSK/EXT:  Moves extremities. No apparent deformity. No edema.  SKIN: no apparent skin lesion or wound NEURO: Awake, alert and oriented appropriately.  No apparent focal neuro deficit. PSYCH: Calm. Normal affect.   Consultants:  Cardiology Gastroenterology  Procedures: None  Microbiology summarized: None  Assessment and plan: Symptomatic severe iron deficiency anemia likely due to lower GI bleed: Patient with history of hemorrhoids.  CT shows diverticulosis but no extravasation.  On Xarelto  for A-fib.  Looks like acute on chronic blood loss.  Anemia panel with severely depleted iron stores.  Symptoms resolved after a unit of blood. Recent Labs    09/13/22 1027 09/02/23 1008 09/03/23 1050 09/04/23 0618  HGB 11.6* 6.6* 6.6* 7.1*  - Monitor H&H.  Transfuse for Hgb < 7.0 or symptomatic anemia or significant bleeding -  IV Venofer 400 mg x 1.  Needs additional dose inpatient or outpatient. - Continue holding Xarelto  - Appreciate GI recs: PPI, CLD, EGD and colo  Paroxysmal A-fib: Rate controlled.  He is on flecainide , Toprol -XL and Xarelto  at home.  CHA2DS2-VASc score of 2 (age and HTN).  QTc 462 on 5/23 -Appreciate cardiology input - Continue flecainide  and Toprol -XL -Hold Xarelto  in the setting of GI bleed - Follow echocardiogram - Optimize electrolytes  AKI: Baseline Cr~1.1 but no recent value.  AKI resolved Recent Labs    09/13/22 1027  09/02/23 1008 09/03/23 1050 09/04/23 0618  BUN 17 20 21 16   CREATININE 1.14 1.67* 1.59* 1.24  - Continue monitoring  Essential hypertension: Normotensive - Continue Toprol -XL  OSA on CPAP - Continue CPAP at night  Language barrier: Spanish speaker.  Does not speak English fair - Used video interpreter for this encounter  Morbid obesity Body mass index is 41.26 kg/m. - Encourage lifestyle change to lose weight          DVT prophylaxis:  SCDs Start: 09/03/23 1429  Code Status: Full code Family Communication: None at bedside Level of care: Telemetry Status is: Observation The patient will require care spanning > 2 midnights and should be moved to inpatient because: Symptomatic severe iron deficiency anemia due to acute on chronic blood loss from lower GI bleed   Final disposition: Home   55 minutes with more than 50% spent in reviewing records, counseling patient/family and coordinating care.   Sch Meds:  Scheduled Meds:  flecainide   100 mg Oral BID   metoprolol  succinate  25 mg Oral BID   NEOMYCIN -POLYMYXIN-HYDROCORTISONE  1 drop Both EARS Q8H   pantoprazole  40 mg Oral BID AC   pneumococcal 20-valent conjugate vaccine  0.5 mL Intramuscular Tomorrow-1000   Continuous Infusions:  iron sucrose     PRN Meds:.acetaminophen **OR** acetaminophen, albuterol, melatonin, ondansetron **OR** ondansetron (ZOFRAN) IV  Antimicrobials: Anti-infectives (From admission, onward)    None        I have personally reviewed the following labs and images: CBC: Recent Labs  Lab 09/02/23 1008 09/03/23 1050 09/04/23 0618  WBC 7.7 7.1 5.7  NEUTROABS  --  4.2  --   HGB 6.6* 6.6* 7.1*  HCT 24.5* 23.4* 24.4*  MCV 68* 66.5* 68.7*  PLT 437 428* 342   BMP &GFR Recent Labs  Lab 09/02/23 1008 09/03/23 1050 09/04/23 0618  NA 140 134* 136  K 4.3 3.7 3.9  CL 102 101 102  CO2 20 25 25   GLUCOSE 97 112* 98  BUN 20 21 16   CREATININE 1.67* 1.59* 1.24  CALCIUM 9.3 9.0  8.7*   Estimated Creatinine Clearance: 76.1 mL/min (by C-G formula based on SCr of 1.24 mg/dL). Liver & Pancreas: Recent Labs  Lab 09/03/23 1050  AST 18  ALT 11  ALKPHOS 70  BILITOT 0.8  PROT 7.7  ALBUMIN 3.9   No results for input(s): "LIPASE", "AMYLASE" in the last 168 hours. No results for input(s): "AMMONIA" in the last 168 hours. Diabetic: No results for input(s): "HGBA1C" in the last 72 hours. No results for input(s): "GLUCAP" in the last 168 hours. Cardiac Enzymes: No results for input(s): "CKTOTAL", "CKMB", "CKMBINDEX", "TROPONINI" in the last 168 hours. No results for input(s): "PROBNP" in the last 8760 hours. Coagulation Profile: Recent Labs  Lab 09/03/23 1050  INR 1.9*   Thyroid  Function Tests: No results for input(s): "TSH", "T4TOTAL", "FREET4", "T3FREE", "THYROIDAB" in the last 72 hours. Lipid Profile: No results  for input(s): "CHOL", "HDL", "LDLCALC", "TRIG", "CHOLHDL", "LDLDIRECT" in the last 72 hours. Anemia Panel: Recent Labs    09/04/23 0816  VITAMINB12 554  FOLATE 12.8  FERRITIN 3*  TIBC 573*  IRON 15*  RETICCTPCT 1.4   Urine analysis: No results found for: "COLORURINE", "APPEARANCEUR", "LABSPEC", "PHURINE", "GLUCOSEU", "HGBUR", "BILIRUBINUR", "KETONESUR", "PROTEINUR", "UROBILINOGEN", "NITRITE", "LEUKOCYTESUR" Sepsis Labs: Invalid input(s): "PROCALCITONIN", "LACTICIDVEN"  Microbiology: No results found for this or any previous visit (from the past 240 hours).  Radiology Studies: No results found.    Carmen Vallecillo T. Taleah Bellantoni [facility]  If 7PM-7AM, please contact night-coverage [email or web address] 09/04/2023, 2:00 PM home.

## 2023-09-04 NOTE — Consult Note (Signed)
 [facility] Consultation Note  Referring Provider: [facility] Primary Care Physician:  Pcp, No Primary Gastroenterologist:  None  Reason for Consultation:  anemia, hematochezia  HPI: Daryl [name] is a 68 y.o. male whom we were asked to see for above reasons.  [employer] Spanish interpretor used for translation.  Patient has anemia and hematochezia.  Patient reports prior intermittent hematochezia for 3-4 years.  Has chronic GERD.  No dysphagia, nausea, vomiting, constipation/straining, diarrhea, weight loss.  Cologuard negative couple years ago.  No prior endoscopy or colonoscopy.  No known family history of colon cancer or polyps.  History of atrial fibrillation with prior cardioversion,    Past Medical History:  Diagnosis Date   A-fib (HCC)    Heartburn    HTN (hypertension)    OSA (obstructive sleep apnea)    Osteoarthritis    knee   Overweight    Sleep apnea    SOBOE (shortness of breath on exertion)     Past Surgical History:  Procedure Laterality Date   CARDIOVERSION N/A 07/14/2020   Procedure: CARDIOVERSION;  Surgeon: Jann Melody, MD;  Location: [facility];  Service: Cardiovascular;  Laterality: N/A;    Prior to Admission medications   Medication Sig Start Date End Date Taking? Authorizing Provider  acetaminophen  (TYLENOL ) 500 MG tablet Take 1,000 mg by mouth as needed for mild pain (pain score 1-3), moderate pain (pain score 4-6) or headache.   Yes [provider]  AFRIN 12 HOUR 0.05 % nasal spray Place 1 spray into both nostrils 2 (two) times daily as needed for congestion.   Yes [provider]  famotidine (PEPCID) 20 MG tablet Take 20 mg by mouth at bedtime as needed for heartburn or indigestion.   Yes [provider]  flecainide  (TAMBOCOR ) 100 MG tablet TAKE ONE TABLET BY MOUTH TWICE DAILY 07/05/23  Yes Fenton, Clint R, PA  metoprolol  succinate (TOPROL -XL) 25 MG 24 hr tablet Take 1 tablet (25 mg total) by mouth 2  (two) times daily. 09/02/23  Yes Fenton, Clint R, PA  PRESCRIPTION MEDICATION See admin instructions. BiPAP- At bedtime   Yes [provider]  rivaroxaban  (XARELTO ) 20 MG TABS tablet TAKE ONE TABLET BY MOUTH ONE TIME DAILY WITH SUPPER Patient taking differently: Take 20 mg by mouth daily after supper. 06/20/23  Yes Fenton, Clint R, PA  sodium chloride  (OCEAN) 0.65 % SOLN nasal spray Place 1 spray into both nostrils as needed for congestion.   Yes [provider]  triamcinolone cream (KENALOG) 0.5 % Apply 1 Application topically 2 (two) times daily as needed (for itching). 02/07/23  Yes [provider]  lisinopril  (ZESTRIL ) 20 MG tablet Take 1 tablet (20 mg total) by mouth daily. Patient not taking: Reported on 09/03/2023 09/02/23 09/01/24  Fenton, Clint R, PA  NEOMYCIN -POLYMYXIN-HYDROCORTISONE (CORTISPORIN ) 1 % SOLN OTIC solution Place 1 drop into both ears daily as needed (itching). Patient not taking: Reported on 09/03/2023 11/20/21   Lucendia Rusk, MD    Current Facility-Administered Medications  Medication Dose Route Frequency Provider Last Rate Last Admin   acetaminophen  (TYLENOL ) tablet 650 mg  650 mg Oral Q6H PRN Gaylin Ke, MD   650 mg at 09/04/23 1308   Or   acetaminophen  (TYLENOL ) suppository 650 mg  650 mg Rectal Q6H PRN Jannette Mend, Mir M, MD       albuterol  (PROVENTIL ) (2.5 MG/3ML) 0.083% nebulizer solution 2.5 mg  2.5 mg Nebulization Q2H PRN Gaylin Ke, MD  flecainide  (TAMBOCOR ) tablet 100 mg  100 mg Oral BID Jannette Mend, Mir M, MD   100 mg at 09/04/23 0960   iron  sucrose (VENOFER ) 400 mg in sodium chloride  0.9 % 250 mL IVPB  400 mg Intravenous Once Gonfa, Taye T, MD       melatonin tablet 5 mg  5 mg Oral QHS PRN Daniels, James K, NP   5 mg at 09/04/23 0033   metoprolol  succinate (TOPROL -XL) 24 hr tablet 25 mg  25 mg Oral BID Jannette Mend, Mir M, MD   25 mg at 09/04/23 4540   NEOMYCIN -POLYMYXIN-HYDROCORTISONE (CORTISPORIN ) OTIC (EAR)  solution 1 drop  1 drop Both EARS Q8H Gonfa, Taye T, MD   1 drop at 09/04/23 1021   ondansetron  (ZOFRAN ) tablet 4 mg  4 mg Oral Q6H PRN Gaylin Ke, MD       Or   ondansetron  (ZOFRAN ) injection 4 mg  4 mg Intravenous Q6H PRN Jannette Mend, Mir M, MD       pneumococcal 20-valent conjugate vaccine (PREVNAR 20 ) injection 0.5 mL  0.5 mL Intramuscular Tomorrow-1000 Jannette Mend, Mir M, MD        Allergies as of 09/03/2023   (No Known Allergies)    Family History  Problem Relation Age of Onset   Hypertension Mother    Obesity Mother    AAA (abdominal aortic aneurysm) Mother    Heart disease Mother    AAA (abdominal aortic aneurysm) Brother     Social History   Socioeconomic History   Marital status: Married    Spouse name: Gleb Mcguire   Number of children: 2   Years of education: Not on file   Highest education level: Not on file  Occupational History   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, technician  Tobacco Use   Smoking status: Never   Smokeless tobacco: Never   Tobacco comments:    Never smoke 06/29/21  Vaping Use   Vaping status: Never Used  Substance and Sexual Activity   Alcohol use: Not Currently    Alcohol/week: 1.0 - 2.0 standard drink of alcohol    Types: 1 - 2 Cans of beer per week    Comment: 1 non alcholic beer   Drug use: Never   Sexual activity: Not on file  Other Topics Concern   Not on file  Social History Narrative   Not on file   Social Drivers of Health   Financial Resource Strain: Not on file  Food Insecurity: No Food Insecurity (09/03/2023)   Hunger Vital Sign    Worried About Running Out of Food in the Last Year: Never true    Ran Out of Food in the Last Year: Never true  Transportation Needs: No Transportation Needs (09/03/2023)   PRAPARE - Administrator, Civil Service (Medical): No    Lack of Transportation (Non-Medical): No  Physical Activity: Not on file  Stress: Not on file  Social  Connections: Socially Integrated (09/03/2023)   Social Connection and Isolation Panel [NHANES]    Frequency of Communication with Friends and Family: More than three times a week    Frequency of Social Gatherings with Friends and Family: Three times a week    Attends Religious Services: More than 4 times per year    Active Member of Clubs or Organizations: Yes    Attends Banker Meetings: More than 4 times per year    Marital Status: Married  Catering manager Violence: Not At  Risk (09/03/2023)   Humiliation, Afraid, Rape, and Kick questionnaire    Fear of Current or Ex-Partner: No    Emotionally Abused: No    Physically Abused: No    Sexually Abused: No    Review of Systems: As per HPI, all others negative  Physical Exam: Vital signs in last 24 hours: Temp:  [98.1 F (36.7 C)-98.5 F (36.9 C)] 98.5 F (36.9 C) (05/25 0619) Pulse Rate:  [61-78] 61 (05/25 0909) Resp:  [16-20] 20 (05/24 2354) BP: (119-138)/(61-76) 138/76 (05/25 0909) SpO2:  [92 %-100 %] 95 % (05/25 0619) FiO2 (%):  [21 %] 21 % (05/24 2249) Weight:  [126.7 kg] 126.7 kg (05/25 0051) Last BM Date : 09/04/23 (very small BM; drops of blood) General:   Alert,  overweight, well-nourished, pleasant and cooperative in NAD Head:  Normocephalic and atraumatic. Eyes:  Sclera clear, no icterus.   Conjunctiva pale Ears:  Normal auditory acuity. Nose:  No deformity, discharge,  or lesions. Mouth:  No deformity or lesions.  Oropharynx pale and dry Neck:  Supple; no masses or thyromegaly. Lungs:  No visible distress Abdomen:  Soft, nontender and nondistended. No masses, hepatosplenomegaly or hernias noted. Without guarding, and without rebound.     Msk:  Symmetrical without gross deformities. Normal posture. Pulses:  Normal pulses noted. Extremities:  Without clubbing or edema. Neurologic:  Alert and  oriented x4;  grossly normal neurologically. Skin:  Intact without significant lesions or rashes. Psych:  Alert  and cooperative. Normal mood and affect.   Lab Results: Recent Labs    09/02/23 1008 09/03/23 1050 09/04/23 0618  WBC 7.7 7.1 5.7  HGB 6.6* 6.6* 7.1*  HCT 24.5* 23.4* 24.4*  PLT 437 428* 342   BMET Recent Labs    09/02/23 1008 09/03/23 1050 09/04/23 0618  NA 140 134* 136  K 4.3 3.7 3.9  CL 102 101 102  CO2 20 25 25   GLUCOSE 97 112* 98  BUN 20 21 16   CREATININE 1.67* 1.59* 1.24  CALCIUM 9.3 9.0 8.7*   LFT Recent Labs    09/03/23 1050  PROT 7.7  ALBUMIN 3.9  AST 18  ALT 11  ALKPHOS 70  BILITOT 0.8   PT/INR Recent Labs    09/03/23 1050  LABPROT 21.6*  INR 1.9*    Studies/Results: CT ANGIO GI BLEED Result Date: 09/03/2023 CLINICAL DATA:  Hematochezia EXAM: CTA ABDOMEN AND PELVIS WITHOUT AND WITH CONTRAST TECHNIQUE: Multidetector CT imaging of the abdomen and pelvis was performed using the standard protocol during bolus administration of intravenous contrast. Multiplanar reconstructed images and MIPs were obtained and reviewed to evaluate the vascular anatomy. RADIATION DOSE REDUCTION: This exam was performed according to the departmental dose-optimization program which includes automated exposure control, adjustment of the mA and/or kV according to patient size and/or use of iterative reconstruction technique. CONTRAST:  80mL OMNIPAQUE  IOHEXOL  350 MG/ML SOLN COMPARISON:  None Available. FINDINGS: VASCULAR No evidence of active arterial extravasation into the GI tract. No abdominal aortic aneurysm or significant vascular stenosis. Review of the MIP images confirms the above findings. NON-VASCULAR Lower chest: No acute abnormality. Hepatobiliary: No focal liver abnormality is seen. No gallstones, gallbladder wall thickening, or biliary dilatation. Pancreas: Unremarkable. No pancreatic ductal dilatation or surrounding inflammatory changes. Spleen: Normal in size without focal abnormality. Adrenals/Urinary Tract: Nothing significant. No hydronephrosis or significant  nephrolithiasis. Stomach/Bowel: Stomach is within normal limits. No evidence of appendicitis. No evidence of bowel wall thickening, distention, or inflammatory changes. Scattered colonic diverticulosis. Lymphatic: No  significant vascular findings are present. No enlarged abdominal or pelvic lymph nodes. Reproductive: Prostate is unremarkable. Other: No abdominal wall hernia or abnormality. No abdominopelvic ascites. Musculoskeletal: No acute or significant osseous findings. IMPRESSION: 1. No evidence of active arterial extravasation into the GI tract. 2. Scattered colonic diverticulosis without evidence of diverticulitis. Electronically Signed   By: Reagan Camera M.D.   On: 09/03/2023 13:48    Impression:   Acute on chronic hematochezia.  Patient reports this has been ongoing intermittently for past 3-4 years.  No prior colonoscopy.  Hemorrhoids, diverticulosis, other? Acute blood loss anemia.  Is iron deficient, so suspect chronic component as well. GERD. Chronic anticoagulation, rivaroxaban . Atrial fibrillation with prior cardioversion.  Plan:   Rivaroxaban  on hold (last dose < 24 hours ago). Follow CBC and transfuse as needed. PPI. Endoscopy and colonoscopy Tuesday 09/06/23. Risks (bleeding, infection, bowel perforation that could require surgery, sedation-related changes in cardiopulmonary systems), benefits (identification and possible treatment of source of symptoms, exclusion of certain causes of symptoms), and alternatives (watchful waiting, radiographic imaging studies, empiric medical treatment) of upper endoscopy (EGD) were explained to patient/family in detail and patient wishes to proceed. Risks (bleeding, infection, bowel perforation that could require surgery, sedation-related changes in cardiopulmonary systems), benefits (identification and possible treatment of source of symptoms, exclusion of certain causes of symptoms), and alternatives (watchful waiting, radiographic imaging  studies, empiric medical treatment) of colonoscopy were explained to patient/family in detail and patient wishes to proceed.  [facility] will follow.   LOS: 0 days   Keven Osborn M  09/04/2023, 1:50 PM  Cell [phone] If no answer or after 5 PM call [phone]

## 2023-09-04 NOTE — TOC Initial Note (Signed)
 Transition of Care [facility]) - Initial/Assessment Note    Patient Details  Name: Daryl [name] MRN: [id number] Date of Birth: Sep 04, 1955  Transition [facility] CM/SW Contact:    Levie Ream, RN Phone Number: 09/04/2023, 3:22 PM  Clinical Narrative:                 No PCP listed; spoke w/ pt in room; pt says he lives at home; he plans to return at d/c; he identified POC [name] (spouse) [phone]; his wife will provide transportation; pt verified insurance; he says his appt w/ new PCP is 09/15/23; he cannot remember the PCP's name; pt denied SDOH risks; pt says he does not have DME, HH services, or home oxygen; TOC will follow.  Expected Discharge Plan: Home/Self Care Barriers to Discharge: Continued Medical Work up   Patient Goals and CMS Choice Patient states their goals for this hospitalization and ongoing recovery are:: home CMS Medicare.gov Compare Post Acute Care list provided to:: Patient   [place] ownership interest in [facility].provided to:: Patient    Expected Discharge Plan and Services   Discharge Planning Services: CM Consult   Living arrangements for the past 2 months: Single Family Home                                      Prior Living Arrangements/Services Living arrangements for the past 2 months: Single Family Home Lives with:: Spouse Patient language and need for interpreter reviewed:: Yes Do you feel safe going back to the place where you live?: Yes      Need for Family Participation in Patient Care: Yes (Comment) Care giver support system in place?: Yes (comment) Current home services:  (n/a) Criminal Activity/Legal Involvement Pertinent to Current Situation/Hospitalization: No - Comment as needed  Activities of Daily Living   ADL Screening (condition at time of admission) Independently performs ADLs?: Yes (appropriate for developmental age) Is the patient deaf or have difficulty hearing?: No Does the patient  have difficulty seeing, even when wearing glasses/contacts?: No Does the patient have difficulty concentrating, remembering, or making decisions?: No  Permission Sought/Granted Permission sought to share information with : Case Manager Permission granted to share information with : Yes, Verbal Permission Granted  Share Information with NAME: Case Manager     Permission granted to share info w Relationship: Dozier Berkovich (spouse) [phone]     Emotional Assessment Appearance:: Appears stated age Attitude/Demeanor/Rapport: Gracious Affect (typically observed): Accepting Orientation: : Oriented to Self, Oriented to Place, Oriented to  Time, Oriented to Situation Alcohol / Substance Use: Not Applicable Psych Involvement: No (comment)  Admission diagnosis:  Blood loss anemia [D50.0] Patient Active Problem List   Diagnosis Date Noted   AKI (acute kidney injury) (HCC) 09/04/2023   Morbid obesity (HCC) 09/04/2023   Iron deficiency anemia 09/04/2023   Diverticulosis 09/04/2023   Blood loss anemia 09/03/2023   Encounter for monitoring flecainide  therapy 05/26/2023   Other fatigue 12/09/2021   SOBOE (shortness of breath on exertion) 12/09/2021   OSA (obstructive sleep apnea) 12/09/2021   Essential hypertension 12/09/2021   Vitamin D  deficiency 12/09/2021   Depression screening 12/09/2021   Hypercoagulable state due to persistent atrial fibrillation (HCC) 06/29/2021   PAF (paroxysmal atrial fibrillation) (HCC) 07/01/2020   Unilateral primary osteoarthritis, left knee 04/11/2019   Unilateral primary osteoarthritis, right knee 04/11/2019   PCP:  Pcp, No Pharmacy:  [id number] - [place], [place] - [address] [place] [address] Phone: [phone] Fax: [phone]     Social Drivers of Health (SDOH) Social History: SDOH Screenings   Food Insecurity: No Food Insecurity (09/04/2023)  Housing: Low Risk  (09/04/2023)  Transportation  Needs: No Transportation Needs (09/04/2023)  Utilities: Not At Risk (09/04/2023)  Depression (PHQ2-9): High Risk (12/09/2021)  Social Connections: Socially Integrated (09/03/2023)  Tobacco Use: Low Risk  (09/03/2023)   SDOH Interventions: Food Insecurity Interventions: Intervention Not Indicated, Inpatient TOC Housing Interventions: Intervention Not Indicated, Inpatient TOC Transportation Interventions: Intervention Not Indicated, Inpatient TOC Utilities Interventions: Intervention Not Indicated, Inpatient TOC   Readmission Risk Interventions     No data to display

## 2023-09-05 ENCOUNTER — Observation Stay (HOSPITAL_COMMUNITY): Payer: Medicare (Managed Care)

## 2023-09-05 DIAGNOSIS — I10 Essential (primary) hypertension: Secondary | ICD-10-CM | POA: Diagnosis not present

## 2023-09-05 DIAGNOSIS — I480 Paroxysmal atrial fibrillation: Secondary | ICD-10-CM | POA: Diagnosis not present

## 2023-09-05 DIAGNOSIS — N179 Acute kidney failure, unspecified: Secondary | ICD-10-CM | POA: Diagnosis not present

## 2023-09-05 DIAGNOSIS — D500 Iron deficiency anemia secondary to blood loss (chronic): Secondary | ICD-10-CM | POA: Diagnosis not present

## 2023-09-05 LAB — CBC
HCT: 26 % — ABNORMAL LOW (ref 39.0–52.0)
Hemoglobin: 7.2 g/dL — ABNORMAL LOW (ref 13.0–17.0)
MCH: 19.6 pg — ABNORMAL LOW (ref 26.0–34.0)
MCHC: 27.7 g/dL — ABNORMAL LOW (ref 30.0–36.0)
MCV: 70.7 fL — ABNORMAL LOW (ref 80.0–100.0)
Platelets: 307 10*3/uL (ref 150–400)
RBC: 3.68 MIL/uL — ABNORMAL LOW (ref 4.22–5.81)
RDW: 19.4 % — ABNORMAL HIGH (ref 11.5–15.5)
WBC: 7.3 10*3/uL (ref 4.0–10.5)
nRBC: 0 % (ref 0.0–0.2)

## 2023-09-05 LAB — BASIC METABOLIC PANEL WITH GFR
Anion gap: 9 (ref 5–15)
BUN: 13 mg/dL (ref 8–23)
CO2: 25 mmol/L (ref 22–32)
Calcium: 8.7 mg/dL — ABNORMAL LOW (ref 8.9–10.3)
Chloride: 101 mmol/L (ref 98–111)
Creatinine, Ser: 1.19 mg/dL (ref 0.61–1.24)
GFR, Estimated: >60 mL/min (ref >60)
Glucose, Bld: 97 mg/dL (ref 70–99)
Potassium: 4 mmol/L (ref 3.5–5.1)
Sodium: 135 mmol/L (ref 135–145)

## 2023-09-05 MED ORDER — PEG 3350-KCL-NA BICARB-NACL 420 G PO SOLR: 4000.0000 mL | Freq: Once | ORAL | Status: DC

## 2023-09-05 MED ORDER — PEG 3350-KCL-NA BICARB-NACL 420 G PO SOLR
4000.0000 mL | Freq: Once | ORAL | Status: AC
  Administered 2023-09-05: 4000 mL via ORAL

## 2023-09-05 MED ORDER — BISACODYL 10 MG RE SUPP
10.0000 mg | Freq: Once | RECTAL | Status: AC
  Administered 2023-09-05: 10 mg via RECTAL
  Filled 2023-09-05: qty 1

## 2023-09-05 MED ORDER — SODIUM CHLORIDE 0.9 % IV SOLN: INTRAVENOUS | Status: AC

## 2023-09-05 NOTE — Progress Notes (Signed)
 PROGRESS NOTE  Daryl [name] NFA:[id number] DOB: 1956-03-14   PCP: Pcp, No  Patient is from: Home.  Independently ambulates at baseline.  DOA: 09/03/2023 LOS: 0  Chief complaints Low hemoglobin and GI bleed  Brief Narrative / Interim history: 68 year old M with PMH of OSA on CPAP, A-fib on flecainide  and Xarelto , hemorrhoids and morbid obesity sent to ED from cardiology office due to hypotension and anemia after he complained dizziness and bright red blood per rectum. Patient has had intermittent bright red blood per rectum that he attributes to hemorrhoids that has gotten worse with Xarelto  and suppositories for constipation.  His hemoglobin of 6.6 (from 11.6 in 09/2022).   In ED, BP elevated to 190/88 but improved quickly.  Hgb 6.6.  MCV 66.7. Cr 1.59.  BUN 21.  CT abdomen and pelvis without extravasation but diverticulosis without evidence of diverticulitis.  1 unit of blood ordered.  Admission requested for symptomatic anemia due to lower GI bleed.  The next day, Hgb improved to 7.1.  Felt well.  GI and cardiology consulted.  Plan for EGD and colonoscopy on 5/27.  Xarelto  remains on hold.  Subjective: Seen and examined earlier this morning.  No major events overnight of this morning.  No complaints.  No further GI bleed.  Denies dizziness, chest pain or shortness of breath.  Aware of the plan for EGD and colonoscopy tomorrow  Objective: Vitals:   09/05/23 0438 09/05/23 0909 09/05/23 0910 09/05/23 1239  BP: 116/78 129/81 129/81 128/75  Pulse: 66 66 67 65  Resp: 18     Temp: 97.7 F (36.5 C)   98.2 F (36.8 C)  TempSrc: Oral     SpO2: 97%  95% 98%  Weight:      Height:        Examination:  GENERAL: No apparent distress.  Nontoxic. HEENT: MMM.  Vision and hearing grossly intact.  NECK: Supple.  No apparent JVD.  RESP:  No IWOB.  Fair aeration bilaterally. CVS:  RRR. Heart sounds normal.  ABD/GI/GU: BS+. Abd soft, NTND.  MSK/EXT:  Moves extremities. No apparent deformity.  No edema.  SKIN: no apparent skin lesion or wound NEURO: Awake, alert and oriented appropriately.  No apparent focal neuro deficit. PSYCH: Calm. Normal affect.   Consultants:  Cardiology Gastroenterology  Procedures: None  Microbiology summarized: None  Assessment and plan: Symptomatic severe iron deficiency anemia likely due to lower GI bleed: history of hemorrhoids.  CT shows diverticulosis but no extravasation. On Xarelto  for A-fib. Anemia panel with severe iron deficiency suggesting acute on chronic blood loss.  Hgb stable after 1 unit.  Symptoms resolved Recent Labs    09/13/22 1027 09/02/23 1008 09/03/23 1050 09/04/23 0618 09/05/23 0522  HGB 11.6* 6.6* 6.6* 7.1* 7.2*  - Monitor H&H.  Transfuse for Hgb < 7.0 or symptomatic anemia or significant bleeding - Received IV Venofer 400 mg on 5/25.  Needs additional dose inpatient or outpatient. - Cardiology recommends holding Xarelto  on discharge until outpatient follow-up - Appreciate GI recs: PPI, CLD, EGD and colo  Paroxysmal A-fib: Remains in sinus rhythm.  He is on flecainide , Toprol -XL and Xarelto  at home.  TTE without significant finding.  CHA2DS2-VASc score of 2 (age and HTN).  QTc 462 on 5/23 -Appreciate cardiology input-recommends holding Xarelto  on discharge until outpatient follow-up -Continue flecainide  and Toprol -XL - Optimize electrolytes  AKI: Baseline Cr~1.1 but no recent value.  AKI resolved Recent Labs    09/13/22 1027 09/02/23 1008 09/03/23 1050 09/04/23 0618 09/05/23 0522  BUN 17 20 21 16 13   CREATININE 1.14 1.67* 1.59* 1.24 1.19  - Continue monitoring  Essential hypertension: Normotensive - Continue Toprol -XL - Continue holding ACE inhibitors  OSA on CPAP - Continue CPAP at night  Language barrier: Spanish speaker.  Does not speak English fair.  Declined interpreter today  Allergies - Antihistamine, Flonase and eardrops  Morbid obesity Body mass index is 41.26 kg/m. -Encourage  lifestyle change to lose weight.  Interested in losing weight -Consulted dietitian for education          DVT prophylaxis:  SCDs Start: 09/03/23 1429  Code Status: Full code Family Communication: None at bedside Level of care: Telemetry Status is: Observation The patient will remain inpatient because: Symptomatic severe iron deficiency anemia due to acute on chronic blood loss from lower GI bleed   Final disposition: Home   55 minutes with more than 50% spent in reviewing records, counseling patient/family and coordinating care.   Sch Meds:  Scheduled Meds:  bisacodyl  10 mg Rectal Once   flecainide   100 mg Oral BID   fluticasone  2 spray Each Nare Daily   loratadine  10 mg Oral Daily   metoprolol  succinate  25 mg Oral BID   NEOMYCIN -POLYMYXIN-HYDROCORTISONE  1 drop Both EARS Q8H   pantoprazole  40 mg Oral BID AC   pneumococcal 20-valent conjugate vaccine  0.5 mL Intramuscular Tomorrow-1000   polyethylene glycol-electrolytes  4,000 mL Oral Once   Continuous Infusions:  sodium chloride      PRN Meds:.acetaminophen **OR** acetaminophen, albuterol, melatonin, ondansetron **OR** ondansetron (ZOFRAN) IV  Antimicrobials: Anti-infectives (From admission, onward)    None        I have personally reviewed the following labs and images: CBC: Recent Labs  Lab 09/02/23 1008 09/03/23 1050 09/04/23 0618 09/05/23 0522  WBC 7.7 7.1 5.7 7.3  NEUTROABS  --  4.2  --   --   HGB 6.6* 6.6* 7.1* 7.2*  HCT 24.5* 23.4* 24.4* 26.0*  MCV 68* 66.5* 68.7* 70.7*  PLT 437 428* 342 307   BMP &GFR Recent Labs  Lab 09/02/23 1008 09/03/23 1050 09/04/23 0618 09/05/23 0522  NA 140 134* 136 135  K 4.3 3.7 3.9 4.0  CL 102 101 102 101  CO2 20 25 25 25   GLUCOSE 97 112* 98 97  BUN 20 21 16 13   CREATININE 1.67* 1.59* 1.24 1.19  CALCIUM 9.3 9.0 8.7* 8.7*   Estimated Creatinine Clearance: 79.3 mL/min (by C-G formula based on SCr of 1.19 mg/dL). Liver & Pancreas: Recent Labs  Lab  09/03/23 1050  AST 18  ALT 11  ALKPHOS 70  BILITOT 0.8  PROT 7.7  ALBUMIN 3.9   No results for input(s): "LIPASE", "AMYLASE" in the last 168 hours. No results for input(s): "AMMONIA" in the last 168 hours. Diabetic: No results for input(s): "HGBA1C" in the last 72 hours. No results for input(s): "GLUCAP" in the last 168 hours. Cardiac Enzymes: No results for input(s): "CKTOTAL", "CKMB", "CKMBINDEX", "TROPONINI" in the last 168 hours. No results for input(s): "PROBNP" in the last 8760 hours. Coagulation Profile: Recent Labs  Lab 09/03/23 1050  INR 1.9*   Thyroid  Function Tests: No results for input(s): "TSH", "T4TOTAL", "FREET4", "T3FREE", "THYROIDAB" in the last 72 hours. Lipid Profile: No results for input(s): "CHOL", "HDL", "LDLCALC", "TRIG", "CHOLHDL", "LDLDIRECT" in the last 72 hours. Anemia Panel: Recent Labs    09/04/23 0816  VITAMINB12 554  FOLATE 12.8  FERRITIN 3*  TIBC 573*  IRON 15*  RETICCTPCT 1.4   Urine analysis: No results found for: "COLORURINE", "APPEARANCEUR", "LABSPEC", "PHURINE", "GLUCOSEU", "HGBUR", "BILIRUBINUR", "KETONESUR", "PROTEINUR", "UROBILINOGEN", "NITRITE", "LEUKOCYTESUR" Sepsis Labs: Invalid input(s): "PROCALCITONIN", "LACTICIDVEN"  Microbiology: No results found for this or any previous visit (from the past 240 hours).  Radiology Studies: No results found.    Mathias Bogacki T. Kenedy Haisley [facility]  If 7PM-7AM, please contact night-coverage [email or web address] 09/05/2023, 2:58 PM home.

## 2023-09-05 NOTE — Progress Notes (Signed)
 PT is currently on Room Air and is tolerating well. Pt is refusing CPAP for tonight due to having to take Bowel Prep tonight for a procedure tomorrow. No distress noted at this time. CPAP in room on standby. Will continue to monitor.

## 2023-09-05 NOTE — Progress Notes (Signed)
 Plan for EGD and colonoscopy tomorrow.  Further GI recommendations pending results of these studies.

## 2023-09-05 NOTE — Plan of Care (Signed)

## 2023-09-05 NOTE — Progress Notes (Signed)
 Progress Note  Patient Name: Daryl [name] Date of Encounter: 09/05/2023  Primary Cardiologist:   Avery Bodo, MD   Subjective   Denies chest pain or SOB.   Inpatient Medications    Scheduled Meds:  flecainide   100 mg Oral BID   fluticasone  2 spray Each Nare Daily   loratadine  10 mg Oral Daily   metoprolol  succinate  25 mg Oral BID   NEOMYCIN -POLYMYXIN-HYDROCORTISONE  1 drop Both EARS Q8H   pantoprazole  40 mg Oral BID AC   pneumococcal 20-valent conjugate vaccine  0.5 mL Intramuscular Tomorrow-1000   Continuous Infusions:  PRN Meds: acetaminophen **OR** acetaminophen, albuterol, melatonin, ondansetron **OR** ondansetron (ZOFRAN) IV   Vital Signs    Vitals:   09/04/23 2024 09/04/23 2342 09/05/23 0112 09/05/23 0438  BP: 118/74   116/78  Pulse: 71   66  Resp: 18 18 10 18   Temp: 98.2 F (36.8 C)   97.7 F (36.5 C)  TempSrc: Oral   Oral  SpO2: 97%   97%  Weight:      Height:       No intake or output data in the 24 hours ending 09/05/23 0755 Filed Weights   09/04/23 0051  Weight: 126.7 kg    Telemetry    NSR - Personally Reviewed  ECG    NA - Personally Reviewed  Physical Exam   GEN: No acute distress.   Neck: No  JVD Cardiac: RRR, no murmurs, rubs, or gallops.  Respiratory: Clear  to auscultation bilaterally. GI: Soft, nontender, non-distended  MS: No  edema; No deformity. Neuro:  Nonfocal  Psych: Normal affect   Labs    Chemistry Recent Labs  Lab 09/03/23 1050 09/04/23 0618 09/05/23 0522  NA 134* 136 135  K 3.7 3.9 4.0  CL 101 102 101  CO2 25 25 25   GLUCOSE 112* 98 97  BUN 21 16 13   CREATININE 1.59* 1.24 1.19  CALCIUM 9.0 8.7* 8.7*  PROT 7.7  --   --   ALBUMIN 3.9  --   --   AST 18  --   --   ALT 11  --   --   ALKPHOS 70  --   --   BILITOT 0.8  --   --   GFRNONAA 47* >60 >60  ANIONGAP 8 9 9      Hematology Recent Labs  Lab 09/03/23 1050 09/04/23 0618 09/04/23 0816 09/05/23 0522  WBC 7.1 5.7  --  7.3  RBC  3.52* 3.55* 3.57* 3.68*  HGB 6.6* 7.1*  --  7.2*  HCT 23.4* 24.4*  --  26.0*  MCV 66.5* 68.7*  --  70.7*  MCH 18.8* 20.0*  --  19.6*  MCHC 28.2* 29.1*  --  27.7*  RDW 17.4* 19.6*  --  19.4*  PLT 428* 342  --  307    Cardiac EnzymesNo results for input(s): "TROPONINI" in the last 168 hours. No results for input(s): "TROPIPOC" in the last 168 hours.   BNPNo results for input(s): "BNP", "PROBNP" in the last 168 hours.   DDimer No results for input(s): "DDIMER" in the last 168 hours.   Radiology    ECHOCARDIOGRAM COMPLETE Result Date: 09/04/2023    ECHOCARDIOGRAM REPORT   Patient Name:   Daryl [name] Date of Exam: 09/04/2023 Medical Rec #:  [id number]      Height:       69.0 in Accession #:    [id number]  Weight:       279.4 lb Date of Birth:  1955-12-31      BSA:          2.381 m Patient Age:    67 years       BP:           138/76 mmHg Patient Gender: M              HR:           62 bpm. Exam Location:  Inpatient Procedure: 2D Echo, Color Doppler and Cardiac Doppler (Both Spectral and Color            Flow Doppler were utilized during procedure). Indications:    I48.91* Unspeicified atrial fibrillation  History:        Patient has prior history of Echocardiogram examinations, most                 recent 07/10/2020.  Sonographer:    Andrena Bang Referring Phys: [id number] CHRISTOPHER L SCHUMANN IMPRESSIONS  1. Left ventricular ejection fraction, by estimation, is 60 to 65%. The left ventricle has normal function. The left ventricle has no regional wall motion abnormalities. There is moderate left ventricular hypertrophy. Left ventricular diastolic parameters were grossly normal.  2. Right ventricular systolic function is normal. The right ventricular size is normal. Tricuspid regurgitation signal is inadequate for assessing PA pressure.  3. Left atrial size was mildly dilated.  4. The mitral valve is grossly normal. Mild to moderate mitral valve regurgitation. No evidence of mitral stenosis.  5. The  aortic valve is tricuspid. Aortic valve regurgitation is trivial. No aortic stenosis is present.  6. The inferior vena cava is dilated in size with <50% respiratory variability, suggesting right atrial pressure of 15 mmHg. FINDINGS  Left Ventricle: Left ventricular ejection fraction, by estimation, is 60 to 65%. The left ventricle has normal function. The left ventricle has no regional wall motion abnormalities. The left ventricular internal cavity size was normal in size. There is  moderate left ventricular hypertrophy. Left ventricular diastolic parameters were normal. Right Ventricle: The right ventricular size is normal. No increase in right ventricular wall thickness. Right ventricular systolic function is normal. Tricuspid regurgitation signal is inadequate for assessing PA pressure. Left Atrium: Left atrial size was mildly dilated. Right Atrium: Right atrial size was normal in size. Pericardium: There is no evidence of pericardial effusion. Mitral Valve: The mitral valve is grossly normal. Mild to moderate mitral valve regurgitation. No evidence of mitral valve stenosis. Tricuspid Valve: The tricuspid valve is normal in structure. Tricuspid valve regurgitation is trivial. No evidence of tricuspid stenosis. Aortic Valve: The aortic valve is tricuspid. Aortic valve regurgitation is trivial. No aortic stenosis is present. Aortic valve mean gradient measures 5.0 mmHg. Aortic valve peak gradient measures 9.9 mmHg. Aortic valve area, by VTI measures 2.85 cm. Pulmonic Valve: The pulmonic valve was normal in structure. Pulmonic valve regurgitation is trivial. No evidence of pulmonic stenosis. Aorta: The aortic root is normal in size and structure. Venous: The inferior vena cava is dilated in size with less than 50% respiratory variability, suggesting right atrial pressure of 15 mmHg. IAS/Shunts: The interatrial septum was not well visualized.  LEFT VENTRICLE PLAX 2D LVIDd:         4.60 cm      Diastology LVIDs:          2.30 cm      LV e' medial:    8.58 cm/s LV PW:  1.40 cm      LV E/e' medial:  12.5 LV IVS:        1.40 cm      LV e' lateral:   18.80 cm/s LVOT diam:     2.40 cm      LV E/e' lateral: 5.7 LV SV:         94 LV SV Index:   39 LVOT Area:     4.52 cm  LV Volumes (MOD) LV vol d, MOD A2C: 140.0 ml LV vol d, MOD A4C: 166.0 ml LV vol s, MOD A2C: 64.3 ml LV vol s, MOD A4C: 58.9 ml LV SV MOD A2C:     75.7 ml LV SV MOD A4C:     166.0 ml LV SV MOD BP:      93.4 ml RIGHT VENTRICLE RV S prime:     10.10 cm/s TAPSE (M-mode): 2.1 cm LEFT ATRIUM             Index LA diam:        5.20 cm 2.18 cm/m LA Vol (A2C):   92.4 ml 38.80 ml/m LA Vol (A4C):   78.4 ml 32.92 ml/m LA Biplane Vol: 90.5 ml 38.00 ml/m  AORTIC VALVE AV Area (Vmax):    2.77 cm AV Area (Vmean):   2.63 cm AV Area (VTI):     2.85 cm AV Vmax:           157.00 cm/s AV Vmean:          102.000 cm/s AV VTI:            0.329 m AV Peak Grad:      9.9 mmHg AV Mean Grad:      5.0 mmHg LVOT Vmax:         96.00 cm/s LVOT Vmean:        59.400 cm/s LVOT VTI:          0.207 m LVOT/AV VTI ratio: 0.63  AORTA Ao Asc diam: 3.40 cm MITRAL VALVE MV Area (PHT): 3.99 cm     SHUNTS MV Decel Time: 190 msec     Systemic VTI:  0.21 m MR Peak grad: 107.7 mmHg    Systemic Diam: 2.40 cm MR Mean grad: 62.0 mmHg MR Vmax:      519.00 cm/s MR Vmean:     361.0 cm/s MV E velocity: 107.00 cm/s MV A velocity: 75.80 cm/s MV E/A ratio:  1.41 Grady Lawman MD Electronically signed by Grady Lawman MD Signature Date/Time: 09/04/2023/2:10:52 PM    Final    CT ANGIO GI BLEED Result Date: 09/03/2023 CLINICAL DATA:  Hematochezia EXAM: CTA ABDOMEN AND PELVIS WITHOUT AND WITH CONTRAST TECHNIQUE: Multidetector CT imaging of the abdomen and pelvis was performed using the standard protocol during bolus administration of intravenous contrast. Multiplanar reconstructed images and MIPs were obtained and reviewed to evaluate the vascular anatomy. RADIATION DOSE REDUCTION: This exam was performed  according to the departmental dose-optimization program which includes automated exposure control, adjustment of the mA and/or kV according to patient size and/or use of iterative reconstruction technique. CONTRAST:  80mL OMNIPAQUE IOHEXOL 350 MG/ML SOLN COMPARISON:  None Available. FINDINGS: VASCULAR No evidence of active arterial extravasation into the GI tract. No abdominal aortic aneurysm or significant vascular stenosis. Review of the MIP images confirms the above findings. NON-VASCULAR Lower chest: No acute abnormality. Hepatobiliary: No focal liver abnormality is seen. No gallstones, gallbladder wall thickening, or biliary dilatation. Pancreas: Unremarkable. No pancreatic ductal dilatation or surrounding inflammatory  changes. Spleen: Normal in size without focal abnormality. Adrenals/Urinary Tract: Nothing significant. No hydronephrosis or significant nephrolithiasis. Stomach/Bowel: Stomach is within normal limits. No evidence of appendicitis. No evidence of bowel wall thickening, distention, or inflammatory changes. Scattered colonic diverticulosis. Lymphatic: No significant vascular findings are present. No enlarged abdominal or pelvic lymph nodes. Reproductive: Prostate is unremarkable. Other: No abdominal wall hernia or abnormality. No abdominopelvic ascites. Musculoskeletal: No acute or significant osseous findings. IMPRESSION: 1. No evidence of active arterial extravasation into the GI tract. 2. Scattered colonic diverticulosis without evidence of diverticulitis. Electronically Signed   By: Reagan Camera M.D.   On: 09/03/2023 13:48    Cardiac Studies   See echo above.   Patient Profile     68 y.o. male  with a hx of hypertension, OSA, atrial fibrillation who is being seen 09/04/2023 for the evaluation of atrial fibrillation at the request of Dr Mae Schlossman.   Assessment & Plan    Paroxysmal atrial fibrillation: CHA2DS2-VASc 2 (age, hypertension).  Holding Xarelto  with GI bleed.  Continue beta  blocker and flecainide .   Echo EF NL as above. Moderate LVH.  Mild LAE.  Mild to Moderate MR.   Looking through notes and talking to him he has not had recurrent fib since flecainide .  He has a history of hemorrhoids.  I would hold his Xareto at discharge and we can discuss this in the [facility] Fib [facility].  Could decide to avoid the DOAC unless there is recurrent fib.  Also, if he has recurrent fib in the future on flecainide  I would have a low threshold for ablation plus or minus Watchman.   MR:  Will follow clinically.     GI bleed:   Hgb 7.2 following transfusion.  Holding DOAC.  Planning EDG/colon on Tuesday.     Hypertension:  BP is controlled on beta blocker.  OK to continue to hold ACE inhibitor.      AKI: Creatinine 1.59 on presentation.   ACE held and creatinine is back to baseline.     For questions or updates, please contact [facility] Please consult [email or web address] for contact info under Cardiology/STEMI.   Signed, Eilleen Grates, MD  09/05/2023, 7:55 AM

## 2023-09-06 ENCOUNTER — Telehealth: Payer: Self-pay | Admitting: Internal Medicine

## 2023-09-06 ENCOUNTER — Telehealth: Payer: Self-pay

## 2023-09-06 ENCOUNTER — Encounter (HOSPITAL_COMMUNITY): Payer: Self-pay | Admitting: Internal Medicine

## 2023-09-06 ENCOUNTER — Observation Stay (HOSPITAL_COMMUNITY): Payer: Medicare (Managed Care) | Admitting: Anesthesiology

## 2023-09-06 ENCOUNTER — Encounter (HOSPITAL_COMMUNITY)
Admission: EM | Disposition: A | Discharge status: Home / Self Care | Payer: Self-pay | Source: Home / Self Care | Attending: Emergency Medicine

## 2023-09-06 ENCOUNTER — Observation Stay (HOSPITAL_COMMUNITY): Payer: Medicare (Managed Care)

## 2023-09-06 DIAGNOSIS — D509 Iron deficiency anemia, unspecified: Secondary | ICD-10-CM | POA: Diagnosis not present

## 2023-09-06 DIAGNOSIS — Z7901 Long term (current) use of anticoagulants: Secondary | ICD-10-CM | POA: Diagnosis not present

## 2023-09-06 DIAGNOSIS — D500 Iron deficiency anemia secondary to blood loss (chronic): Secondary | ICD-10-CM | POA: Diagnosis not present

## 2023-09-06 DIAGNOSIS — K642 Third degree hemorrhoids: Secondary | ICD-10-CM | POA: Diagnosis not present

## 2023-09-06 DIAGNOSIS — K921 Melena: Secondary | ICD-10-CM

## 2023-09-06 DIAGNOSIS — Z5181 Encounter for therapeutic drug level monitoring: Secondary | ICD-10-CM

## 2023-09-06 DIAGNOSIS — I480 Paroxysmal atrial fibrillation: Secondary | ICD-10-CM | POA: Diagnosis not present

## 2023-09-06 HISTORY — PX: COLONOSCOPY: SHX5424

## 2023-09-06 HISTORY — PX: ESOPHAGOGASTRODUODENOSCOPY: SHX5428

## 2023-09-06 LAB — RENAL FUNCTION PANEL
Albumin: 3.5 g/dL (ref 3.5–5.0)
Anion gap: 6 (ref 5–15)
BUN: 9 mg/dL (ref 8–23)
CO2: 24 mmol/L (ref 22–32)
Calcium: 8.3 mg/dL — ABNORMAL LOW (ref 8.9–10.3)
Chloride: 102 mmol/L (ref 98–111)
Creatinine, Ser: 1.15 mg/dL (ref 0.61–1.24)
GFR, Estimated: >60 mL/min (ref >60)
Glucose, Bld: 92 mg/dL (ref 70–99)
Phosphorus: 3.2 mg/dL (ref 2.5–4.6)
Potassium: 3.8 mmol/L (ref 3.5–5.1)
Sodium: 132 mmol/L — ABNORMAL LOW (ref 135–145)

## 2023-09-06 LAB — CBC
HCT: 25.7 % — ABNORMAL LOW (ref 39.0–52.0)
Hemoglobin: 7 g/dL — ABNORMAL LOW (ref 13.0–17.0)
MCH: 19.4 pg — ABNORMAL LOW (ref 26.0–34.0)
MCHC: 27.2 g/dL — ABNORMAL LOW (ref 30.0–36.0)
MCV: 71.2 fL — ABNORMAL LOW (ref 80.0–100.0)
Platelets: 315 10*3/uL (ref 150–400)
RBC: 3.61 MIL/uL — ABNORMAL LOW (ref 4.22–5.81)
RDW: 19.8 % — ABNORMAL HIGH (ref 11.5–15.5)
WBC: 8.4 10*3/uL (ref 4.0–10.5)
nRBC: 0.4 % — ABNORMAL HIGH (ref 0.0–0.2)

## 2023-09-06 LAB — HEMOGLOBIN AND HEMATOCRIT, BLOOD
HCT: 30.2 % — ABNORMAL LOW (ref 39.0–52.0)
Hemoglobin: 8.5 g/dL — ABNORMAL LOW (ref 13.0–17.0)

## 2023-09-06 LAB — MAGNESIUM: Magnesium: 2 mg/dL (ref 1.7–2.4)

## 2023-09-06 LAB — PREPARE RBC (CROSSMATCH)

## 2023-09-06 SURGERY — EGD (ESOPHAGOGASTRODUODENOSCOPY)
Anesthesia: Monitor Anesthesia Care | Laterality: Left

## 2023-09-06 MED ORDER — PROPOFOL 1000 MG/100ML IV EMUL
INTRAVENOUS | Status: AC
Start: 2023-09-06 — End: ?
  Filled 2023-09-06: qty 100

## 2023-09-06 MED ORDER — SODIUM CHLORIDE 0.9 % IV SOLN
INTRAVENOUS | Status: DC | PRN
Start: 2023-09-06 — End: 2023-09-06

## 2023-09-06 MED ORDER — PROPOFOL 10 MG/ML IV BOLUS
INTRAVENOUS | Status: DC | PRN
  Administered 2023-09-06: 85 ug/kg/min via INTRAVENOUS

## 2023-09-06 MED ORDER — KETAMINE HCL 10 MG/ML IJ SOLN
INTRAMUSCULAR | Status: AC
  Filled 2023-09-06: qty 1

## 2023-09-06 MED ORDER — GLYCOPYRROLATE PF 0.2 MG/ML IJ SOSY
PREFILLED_SYRINGE | INTRAMUSCULAR | Status: DC | PRN
Start: 2023-09-06 — End: 2023-09-06
  Administered 2023-09-06: .2 mg via INTRAVENOUS

## 2023-09-06 MED ORDER — KETAMINE HCL 10 MG/ML IJ SOLN
INTRAMUSCULAR | Status: DC | PRN
  Administered 2023-09-06: 30 mg via INTRAVENOUS

## 2023-09-06 MED ORDER — LIDOCAINE 2% (20 MG/ML) 5 ML SYRINGE
INTRAMUSCULAR | Status: DC | PRN
  Administered 2023-09-06: 100 mg via INTRAVENOUS

## 2023-09-06 MED ORDER — SODIUM CHLORIDE 0.9% IV SOLUTION: Freq: Once | INTRAVENOUS | Status: AC

## 2023-09-06 NOTE — Progress Notes (Signed)
 PROGRESS NOTE  Daryl [name]  DOB: 11/21/1955  PCP: Juvenal Opoka IRS:[id number]  DOA: 09/03/2023  LOS: 0 days  Hospital Day: 4  Brief narrative: Daryl [name] is a 68 y.o. male with PMH significant for obesity, OSA on CPAP, HTN, A-fib on flecainide /Xarelto , constipation, hemorrhoids who has ongoing intermittent BRBPR for the last 3 to 4 years. 5/24, patient presented to ED with complaint dizziness in the setting of chronic bleeding. Workup in the ED showed hemoglobin low at 6.6 CT abdomen and pelvis did not show any extravasation of blood but showed diverticulosis without evidence of diverticulitis.   1 unit of PRBC transfusion was given. Admitted to [facility] Xarelto  was held GI and cardiology were consulted  Subjective: Patient was seen and examined this morning. Elderly male of Hispanic origin.  Not in distress. Sitting up in recliner. Underwent EGD and colonoscopy later this morning.  Findings as below.  Assessment and plan: Acute on chronic GI bleeding H/o diverticulosis, hemorrhoids Presented with ongoing intermittent BRBPR, low hemoglobin, in the setting of Xarelto  use Also has history of diverticulosis and hemorrhoids 5/27, underwent EGD and colonoscopy today.  Per report, no other finding except for grade 3 hemorrhoids.  Potential source of bleeding.  But given the severity of anemia, GI plans to do capsule endoscopy to rule out any small intestinal bleeding.  Acute on chronic blood loss anemia Severe iron deficiency Presented with a hemoglobin of 6.6.  1 unit PRBC transfusion was given.  Hemoglobin at 7 today.  I believe he would benefit from 1 more unit of transfusion today.  Consent taken from patient.  Ordered. Received total of 400 mg of IV iron this hospitalization.  Would benefit from outpatient follow-up for further infusion.  Will avoid oral iron supplement because of severe constipation.   Repeat hemoglobin tomorrow. Xarelto  remains on hold for now Recent Labs     09/02/23 1008 09/03/23 1050 09/04/23 0618 09/04/23 0816 09/05/23 0522 09/06/23 0457  HGB 6.6* 6.6* 7.1*  --  7.2* 7.0*  MCV 68* 66.5* 68.7*  --  70.7* 71.2*  VITAMINB12  --   --   --  554  --   --   FOLATE  --   --   --  12.8  --   --   FERRITIN  --   --   --  3*  --   --   TIBC  --   --   --  573*  --   --   IRON  --   --   --  15*  --   --   RETICCTPCT  --   --   --  1.4  --   --     Paroxysmal A-fib Currently in sinus rhythm.   Current flecainide , Toprol -XL  Chronically anticoagulated with Xarelto , currently on hold.  Essential hypertension Blood pressure controlled on Toprol .   AKI Creatinine improved. Recent Labs    09/13/22 1027 09/02/23 1008 09/03/23 1050 09/04/23 0618 09/05/23 0522 09/06/23 0457  BUN 17 20 21 16 13 9   CREATININE 1.14 1.67* 1.59* 1.24 1.19 1.15    OSA on CPAP Continue CPAP at night    Allergies Antihistamine, Flonase and eardrops  Chronic constipation Scheduled and as needed bowel regimen  Obesity 3 Body mass index is 39.87 kg/m. Patient has been advised to make an attempt to improve diet and exercise patterns to aid in weight loss.  mobility: Encourage ambulation  Goals of care   Code Status: Full  Code     DVT prophylaxis:  SCDs Start: 09/03/23 1429   Antimicrobials: None Fluid: None Consultants: GI Family Communication: None at bedside  Status: Observation Level of care:  Telemetry   Patient is from: Home Needs to continue in-hospital care: Pending capsule endoscopy Anticipated d/c to: Hopefully home in 1 to 2 days    Diet:  Diet Order             Diet NPO time specified  Diet effective midnight           DIET DYS 3 Room service appropriate? Yes; Fluid consistency: Thin  Diet effective now                   Scheduled Meds:  sodium chloride    Intravenous Once   flecainide   100 mg Oral BID   fluticasone  2 spray Each Nare Daily   loratadine  10 mg Oral Daily   metoprolol  succinate  25 mg Oral BID    NEOMYCIN -POLYMYXIN-HYDROCORTISONE  1 drop Both EARS Q8H   pantoprazole  40 mg Oral BID AC   pneumococcal 20-valent conjugate vaccine  0.5 mL Intramuscular Tomorrow-1000    PRN meds: acetaminophen **OR** acetaminophen, albuterol, melatonin, ondansetron **OR** ondansetron (ZOFRAN) IV   Infusions:   sodium chloride  10 mL/hr at 09/05/23 1735    Antimicrobials: Anti-infectives (From admission, onward)    None       Objective: Vitals:   09/06/23 1145 09/06/23 1150  BP:  105/89  Pulse: 65 66  Resp: 18 18  Temp:    SpO2: 94% 92%    Intake/Output Summary (Last 24 hours) at 09/06/2023 1313 Last data filed at 09/06/2023 1113 Gross per 24 hour  Intake 250 ml  Output --  Net 250 ml   Filed Weights   09/04/23 0051 09/06/23 1004  Weight: 126.7 kg 122.5 kg   Weight change:  Body mass index is 39.87 kg/m.   Physical Exam: General exam: Pleasant, elderly male of Hispanic origin Skin: No rashes, lesions or ulcers. HEENT: Atraumatic, normocephalic, no obvious bleeding Lungs: Clear to auscultation bilaterally,  CVS: S1, S2, no murmur,   GI/Abd: Soft, nontender, nondistended, bowel sound present,   CNS: Alert, awake, oriented x 3 Psychiatry: Mood appropriate,  Extremities: No pedal edema, no calf tenderness,   Data Review: I have personally reviewed the laboratory data and studies available.  F/u labs ordered Unresulted Labs (From admission, onward)     Start     Ordered   09/07/23 0500  Basic metabolic panel  Tomorrow morning,   R        09/06/23 0825   09/07/23 0500  Magnesium  Tomorrow morning,   R        09/06/23 0825   09/07/23 0500  CBC  Tomorrow morning,   R        09/06/23 0825   09/06/23 1310  Prepare RBC (crossmatch)  (Blood Administration Adult)  Once,   R       Question Answer Comment  # of Units 1 unit   Transfusion Indications Hemoglobin < 7 gm/dL and symptomatic   Number of Units to Keep Ahead NO units ahead   If emergent release call blood bank Not  emergent release      09/06/23 1309           Signed, Hoyt Macleod, MD [facility] 09/06/2023

## 2023-09-06 NOTE — Anesthesia Postprocedure Evaluation (Signed)
 Anesthesia Post Note  Patient: [name]  Procedure(s) Performed: EGD (ESOPHAGOGASTRODUODENOSCOPY) (Left) COLONOSCOPY (Left)     Patient location during evaluation: PACU Anesthesia Type: MAC Level of consciousness: awake and alert Pain management: pain level controlled Vital Signs Assessment: post-procedure vital signs reviewed and stable Respiratory status: spontaneous breathing, nonlabored ventilation and respiratory function stable Cardiovascular status: stable and blood pressure returned to baseline Anesthetic complications: no  No notable events documented.  Last Vitals:  Vitals:   09/06/23 1145 09/06/23 1150  BP:  105/89  Pulse: 65 66  Resp: 18 18  Temp:    SpO2: 94% 92%    Last Pain:  Vitals:   09/06/23 1125  TempSrc: Temporal  PainSc:                  Juventino Oppenheim

## 2023-09-06 NOTE — Interval H&P Note (Signed)
 History and Physical Interval Note:  09/06/2023 10:39 AM  Daryl [name]  has presented today for surgery, with the diagnosis of abdominal pain, blood in stool.  The various methods of treatment have been discussed with the patient and family. After consideration of risks, benefits and other options for treatment, the patient has consented to  Procedure(s): EGD (ESOPHAGOGASTRODUODENOSCOPY) (Left) COLONOSCOPY (Left) as a surgical intervention.  The patient's history has been reviewed, patient examined, no change in status, stable for surgery.  I have reviewed the patient's chart and labs.  Questions were answered to the patient's satisfaction.     [name]

## 2023-09-06 NOTE — Plan of Care (Signed)

## 2023-09-06 NOTE — Transfer of Care (Signed)
 Immediate Anesthesia Transfer of Care Note  Patient: Daryl [name]  Procedure(s) Performed: EGD (ESOPHAGOGASTRODUODENOSCOPY) (Left) COLONOSCOPY (Left)  Patient Location: PACU and Endoscopy Unit  Anesthesia Type:MAC  Level of Consciousness: awake, alert , and oriented  Airway & Oxygen Therapy: Patient Spontanous Breathing and Patient connected to face mask oxygen  Post-op Assessment: Report given to RN  Post vital signs: Reviewed and stable  Last Vitals:  Vitals Value Taken Time  BP 114/54 09/06/23 1122  Temp    Pulse 65 09/06/23 1124  Resp 18 09/06/23 1124  SpO2 100 % 09/06/23 1124  Vitals shown include unfiled device data.  Last Pain:  Vitals:   09/06/23 1004  TempSrc: Temporal  PainSc: 0-No pain         Complications: No notable events documented.

## 2023-09-06 NOTE — Anesthesia Procedure Notes (Signed)
 Procedure Name: MAC Date/Time: 09/06/2023 10:45 AM  Performed by: Darlena Ego, CRNAPre-anesthesia Checklist: Patient identified, Emergency Drugs available, Suction available, Patient being monitored and Timeout performed Patient Re-evaluated:Patient Re-evaluated prior to induction Oxygen Delivery Method: Simple face mask Preoxygenation: Pre-oxygenation with 100% oxygen Induction Type: IV induction

## 2023-09-06 NOTE — Op Note (Signed)
 [facility] Patient Name: Daryl [name] Procedure Date: 09/06/2023 MRN: [id number] Attending MD: Evangeline Hilts , MD, [id number] Date of Birth: 01/23/1956 CSN: [id number] Age: 68 Admit Type: Inpatient Procedure:                Upper GI endoscopy Indications:              Iron deficiency anemia, Hematochezia Providers:                Evangeline Hilts, MD, Golda Latch, RN, Joline Ned, Technician Referring MD:              Medicines:                Monitored Anesthesia Care Complications:            No immediate complications. Estimated Blood Loss:     Estimated blood loss: none. Procedure:                Pre-Anesthesia Assessment:                           - Prior to the procedure, a History and Physical                            was performed, and patient medications and                            allergies were reviewed. The patient's tolerance of                            previous anesthesia was also reviewed. The risks                            and benefits of the procedure and the sedation                            options and risks were discussed with the patient.                            All questions were answered, and informed consent                            was obtained. Prior Anticoagulants: The patient has                            taken Xarelto  (rivaroxaban ), last dose was 3 days                            prior to procedure. ASA Grade Assessment: III - A                            patient with severe systemic disease. After  reviewing the risks and benefits, the patient was                            deemed in satisfactory condition to undergo the                            procedure.                           After obtaining informed consent, the endoscope was                            passed under direct vision. Throughout the                            procedure, the patient's blood  pressure, pulse, and                            oxygen saturations were monitored continuously. The                            [id number]-[id number] ([id number]) Olympus endoscope was introduced                            through the mouth, and advanced to the second part                            of duodenum. The upper GI endoscopy was                            accomplished without difficulty. The patient                            tolerated the procedure well. Scope In: Scope Out: Findings:      The examined esophagus was normal.      The entire examined stomach was normal.      The duodenal bulb, first portion of the duodenum and second portion of       the duodenum were normal.      No old or fresh blood was seen to the extent of our examination. Impression:               - Normal esophagus.                           - Normal stomach.                           - Normal duodenal bulb, first portion of the                            duodenum and second portion of the duodenum.                           - No specimens collected. Moderate Sedation:      Not Applicable - Patient had care per  Anesthesia. Recommendation:           - Perform a colonoscopy today. Procedure Code(s):        --- Professional ---                           [phone], Esophagogastroduodenoscopy, flexible,                            transoral; diagnostic, including collection of                            specimen(s) by brushing or washing, when performed                            (separate procedure) Diagnosis Code(s):        --- Professional ---                           D50.9, Iron deficiency anemia, unspecified                           K92.1, Melena (includes Hematochezia) CPT copyright 2022 American Medical Association. All rights reserved. The codes documented in this report are preliminary and upon coder review may  be revised to meet current compliance requirements. Evangeline Hilts, MD 09/06/2023 11:23:30 AM This  report has been signed electronically. Number of Addenda: 0

## 2023-09-06 NOTE — Telephone Encounter (Signed)
 Auth Submission: NO AUTH NEEDED Site of care: Site of care: [facility] Payer: Medicare A/B only Medication & CPT/J Code(s) submitted: Feraheme (ferumoxytol) [id number] Route of submission (phone, fax, portal):  Phone # Fax # Auth type: Buy/Bill PB Units/visits requested: 510mg  x 2 doses Reference number:  Approval from: 09/06/23 to 01/07/24  Medicare A/B will cover 80%, patient will be responsible for the 20%.

## 2023-09-06 NOTE — Progress Notes (Addendum)
 Progress Note  Patient Name: Daryl [name] Date of Encounter: 09/06/2023  Primary Cardiologist: Avery Bodo, MD  Subjective   Feeling well, no cardiac complaints. Monitor showing NSR. Pt declined interpreter use, was able to communicate in [place]. He relayed he is leaving for [place] 6/17 and will return mid July around the 13th or so.  Inpatient Medications    Scheduled Meds:  flecainide   100 mg Oral BID   fluticasone  2 spray Each Nare Daily   loratadine  10 mg Oral Daily   metoprolol  succinate  25 mg Oral BID   NEOMYCIN -POLYMYXIN-HYDROCORTISONE  1 drop Both EARS Q8H   pantoprazole  40 mg Oral BID AC   pneumococcal 20-valent conjugate vaccine  0.5 mL Intramuscular Tomorrow-1000   Continuous Infusions:  sodium chloride  10 mL/hr at 09/05/23 1735   PRN Meds: acetaminophen **OR** acetaminophen, albuterol, melatonin, ondansetron **OR** ondansetron (ZOFRAN) IV   Vital Signs    Vitals:   09/05/23 1239 09/05/23 2022 09/05/23 2152 09/06/23 0404  BP: 128/75 (!) 147/81  116/62  Pulse: 65 68  66  Resp:  17 20 17   Temp: 98.2 F (36.8 C) 98.4 F (36.9 C)  98 F (36.7 C)  TempSrc: Oral     SpO2: 98% 98%  94%  Weight:      Height:       No intake or output data in the 24 hours ending 09/06/23 0816    09/04/2023   12:51 AM 09/02/2023    8:53 AM 05/26/2023    3:06 PM  Last 3 Weights  Weight (lbs) 279 lb 6.2 oz 277 lb 3.2 oz 277 lb 9.6 oz  Weight (kg) 126.73 kg 125.737 kg 125.919 kg     Telemetry    NSR - Personally Reviewed  Physical Exam   GEN: No acute distress.  HEENT: Normocephalic, atraumatic, sclera non-icteric. Neck: No JVD or bruits. Cardiac: RRR no murmurs, rubs, or gallops.  Respiratory: Clear to auscultation bilaterally. Breathing is unlabored. GI: Soft, nontender, non-distended, BS +x 4. MS: no deformity. Extremities: No clubbing or cyanosis. No edema. Distal pedal pulses are 2+ and equal bilaterally. Neuro:  AAOx3. Follows  commands. Psych:  Responds to questions appropriately with a normal affect.  Labs    High Sensitivity Troponin:  No results for input(s): "TROPONINIHS" in the last 720 hours.    Cardiac EnzymesNo results for input(s): "TROPONINI" in the last 168 hours. No results for input(s): "TROPIPOC" in the last 168 hours.   Chemistry Recent Labs  Lab 09/03/23 1050 09/04/23 0618 09/05/23 0522 09/06/23 0457  NA 134* 136 135 132*  K 3.7 3.9 4.0 3.8  CL 101 102 101 102  CO2 25 25 25 24   GLUCOSE 112* 98 97 92  BUN 21 16 13 9   CREATININE 1.59* 1.24 1.19 1.15  CALCIUM 9.0 8.7* 8.7* 8.3*  PROT 7.7  --   --   --   ALBUMIN 3.9  --   --  3.5  AST 18  --   --   --   ALT 11  --   --   --   ALKPHOS 70  --   --   --   BILITOT 0.8  --   --   --   GFRNONAA 47* >60 >60 >60  ANIONGAP 8 9 9 6      Hematology Recent Labs  Lab 09/04/23 0618 09/04/23 0816 09/05/23 0522 09/06/23 0457  WBC 5.7  --  7.3 8.4  RBC 3.55* 3.57* 3.68*  3.61*  HGB 7.1*  --  7.2* 7.0*  HCT 24.4*  --  26.0* 25.7*  MCV 68.7*  --  70.7* 71.2*  MCH 20.0*  --  19.6* 19.4*  MCHC 29.1*  --  27.7* 27.2*  RDW 19.6*  --  19.4* 19.8*  PLT 342  --  307 315    BNPNo results for input(s): "BNP", "PROBNP" in the last 168 hours.   DDimer No results for input(s): "DDIMER" in the last 168 hours.   Radiology    DG Chest 2 View Result Date: 09/05/2023 CLINICAL DATA:  Cough. EXAM: CHEST - 2 VIEW COMPARISON:  09/13/2022. FINDINGS: Cardiac silhouette is unremarkable. No pneumothorax or pleural effusion. The lungs are clear. There are thoracic degenerative changes. IMPRESSION: No acute cardiopulmonary process. Electronically Signed   By: Sydell Eva M.D.   On: 09/05/2023 19:14   ECHOCARDIOGRAM COMPLETE Result Date: 09/04/2023    ECHOCARDIOGRAM REPORT   Patient Name:   Daryl [name] Date of Exam: 09/04/2023 Medical Rec #:  [id number]      Height:       69.0 in Accession #:    [id number]     Weight:       279.4 lb Date of Birth:  [birth year]       BSA:          2.381 m Patient Age:    67 years       BP:           138/76 mmHg Patient Gender: M              HR:           62 bpm. Exam Location:  Inpatient Procedure: 2D Echo, Color Doppler and Cardiac Doppler (Both Spectral and Color            Flow Doppler were utilized during procedure). Indications:    I48.91* Unspeicified atrial fibrillation  History:        Patient has prior history of Echocardiogram examinations, most                 recent 07/10/2020.  Sonographer:    Andrena Bang Referring Phys: [id number] CHRISTOPHER L SCHUMANN IMPRESSIONS  1. Left ventricular ejection fraction, by estimation, is 60 to 65%. The left ventricle has normal function. The left ventricle has no regional wall motion abnormalities. There is moderate left ventricular hypertrophy. Left ventricular diastolic parameters were grossly normal.  2. Right ventricular systolic function is normal. The right ventricular size is normal. Tricuspid regurgitation signal is inadequate for assessing PA pressure.  3. Left atrial size was mildly dilated.  4. The mitral valve is grossly normal. Mild to moderate mitral valve regurgitation. No evidence of mitral stenosis.  5. The aortic valve is tricuspid. Aortic valve regurgitation is trivial. No aortic stenosis is present.  6. The inferior vena cava is dilated in size with <50% respiratory variability, suggesting right atrial pressure of 15 mmHg. FINDINGS  Left Ventricle: Left ventricular ejection fraction, by estimation, is 60 to 65%. The left ventricle has normal function. The left ventricle has no regional wall motion abnormalities. The left ventricular internal cavity size was normal in size. There is  moderate left ventricular hypertrophy. Left ventricular diastolic parameters were normal. Right Ventricle: The right ventricular size is normal. No increase in right ventricular wall thickness. Right ventricular systolic function is normal. Tricuspid regurgitation signal is inadequate for  assessing PA pressure. Left Atrium: Left atrial size was mildly dilated. Right Atrium: Right  atrial size was normal in size. Pericardium: There is no evidence of pericardial effusion. Mitral Valve: The mitral valve is grossly normal. Mild to moderate mitral valve regurgitation. No evidence of mitral valve stenosis. Tricuspid Valve: The tricuspid valve is normal in structure. Tricuspid valve regurgitation is trivial. No evidence of tricuspid stenosis. Aortic Valve: The aortic valve is tricuspid. Aortic valve regurgitation is trivial. No aortic stenosis is present. Aortic valve mean gradient measures 5.0 mmHg. Aortic valve peak gradient measures 9.9 mmHg. Aortic valve area, by VTI measures 2.85 cm. Pulmonic Valve: The pulmonic valve was normal in structure. Pulmonic valve regurgitation is trivial. No evidence of pulmonic stenosis. Aorta: The aortic root is normal in size and structure. Venous: The inferior vena cava is dilated in size with less than 50% respiratory variability, suggesting right atrial pressure of 15 mmHg. IAS/Shunts: The interatrial septum was not well visualized.  LEFT VENTRICLE PLAX 2D LVIDd:         4.60 cm      Diastology LVIDs:         2.30 cm      LV e' medial:    8.58 cm/s LV PW:         1.40 cm      LV E/e' medial:  12.5 LV IVS:        1.40 cm      LV e' lateral:   18.80 cm/s LVOT diam:     2.40 cm      LV E/e' lateral: 5.7 LV SV:         94 LV SV Index:   39 LVOT Area:     4.52 cm  LV Volumes (MOD) LV vol d, MOD A2C: 140.0 ml LV vol d, MOD A4C: 166.0 ml LV vol s, MOD A2C: 64.3 ml LV vol s, MOD A4C: 58.9 ml LV SV MOD A2C:     75.7 ml LV SV MOD A4C:     166.0 ml LV SV MOD BP:      93.4 ml RIGHT VENTRICLE RV S prime:     10.10 cm/s TAPSE (M-mode): 2.1 cm LEFT ATRIUM             Index LA diam:        5.20 cm 2.18 cm/m LA Vol (A2C):   92.4 ml 38.80 ml/m LA Vol (A4C):   78.4 ml 32.92 ml/m LA Biplane Vol: 90.5 ml 38.00 ml/m  AORTIC VALVE AV Area (Vmax):    2.77 cm AV Area (Vmean):   2.63 cm  AV Area (VTI):     2.85 cm AV Vmax:           157.00 cm/s AV Vmean:          102.000 cm/s AV VTI:            0.329 m AV Peak Grad:      9.9 mmHg AV Mean Grad:      5.0 mmHg LVOT Vmax:         96.00 cm/s LVOT Vmean:        59.400 cm/s LVOT VTI:          0.207 m LVOT/AV VTI ratio: 0.63  AORTA Ao Asc diam: 3.40 cm MITRAL VALVE MV Area (PHT): 3.99 cm     SHUNTS MV Decel Time: 190 msec     Systemic VTI:  0.21 m MR Peak grad: 107.7 mmHg    Systemic Diam: 2.40 cm MR Mean grad: 62.0 mmHg MR Vmax:  519.00 cm/s MR Vmean:     361.0 cm/s MV E velocity: 107.00 cm/s MV A velocity: 75.80 cm/s MV E/A ratio:  1.41 Grady Lawman MD Electronically signed by Grady Lawman MD Signature Date/Time: 09/04/2023/2:10:52 PM    Final     Cardiac Studies   2d echo 09/04/23  1. Left ventricular ejection fraction, by estimation, is 60 to 65%. The  left ventricle has normal function. The left ventricle has no regional  wall motion abnormalities. There is moderate left ventricular hypertrophy.  Left ventricular diastolic  parameters were grossly normal.   2. Right ventricular systolic function is normal. The right ventricular  size is normal. Tricuspid regurgitation signal is inadequate for assessing  PA pressure.   3. Left atrial size was mildly dilated.   4. The mitral valve is grossly normal. Mild to moderate mitral valve  regurgitation. No evidence of mitral stenosis.   5. The aortic valve is tricuspid. Aortic valve regurgitation is trivial.  No aortic stenosis is present.   6. The inferior vena cava is dilated in size with <50% respiratory  variability, suggesting right atrial pressure of 15 mmHg.   Patient Profile     68 y.o. male with HTN, OSA, calcium score of 0 in 2023, morbid obesity, persistent afib s/p prior DCCVs subsequently on flecainide  admitted with hematochezia, ABL anemia, GIB.  Assessment & Plan    1. Symptomatic ABL anemia felt due to lower GIB, with concomitant AKI, mild hyponatremia,  hypocalcemia - s/p blood transfusion and IV Venofer this admission, iron deficiency noted - for EGD/colonoscopy today - Xarelto  to remain on hold until revisiting in follow-up - AKI resolved back near baseline  - further per primary  2. Persistent atrial fibrillation s/p prior DCCVs, flecainide , chronic borderline NSVICD unchanged from prior - patient appears to have held sinus since flecainide  initiation in 2022 - continue PTA regimen of flecainide  100mg  BID and Toprol  25mg  BID - per Dr. Lavonne Prairie "would hold his Xarelto  at discharge and we can discuss this in the Atrial Fib Clinic. Could decide to avoid the DOAC unless there is recurrent fib. Also, if he has recurrent fib in the future on flecainide  I would have a low threshold for ablation plus or minus Watchman." I sent msg to afib clinic team to move up appt prior to him leaving the country  - Mg at goal, K 3.8 slightly below goal of 4.0 - will defer to primary re: repletion given NPO status  3. Mild-moderate MR - consider repeat echo 08/2024, no acute intervention needed  4. OSA on CPAP, morbid obesity - continue CPAP at bedtime - outpatient follow-up  Remainder per primary. Anticipate cardiology will s/o today.  For questions or updates, please contact [facility] Please consult [email or web address] for contact info under Cardiology/STEMI.  Signed, Nycholas Rayner N Hertha Gergen, PA-C 09/06/2023, 8:16 AM

## 2023-09-06 NOTE — Op Note (Signed)
 [facility] Patient Name: Daryl [name] Procedure Date: 09/06/2023 MRN: [id number] Attending MD: Evangeline Hilts , MD, [id number] Date of Birth: [birth year] CSN: [id number] Age: 68 Admit Type: Inpatient Procedure:                Colonoscopy Indications:              This is the patient's first colonoscopy,                            Hematochezia, Iron  deficiency anemia Providers:                Evangeline Hilts, MD, Golda Latch, RN, Joline Ned, Technician Referring MD:              Medicines:                Monitored Anesthesia Care Complications:            No immediate complications. Estimated Blood Loss:     Estimated blood loss: none. Procedure:                Pre-Anesthesia Assessment:                           - Prior to the procedure, a History and Physical                            was performed, and patient medications and                            allergies were reviewed. The patient's tolerance of                            previous anesthesia was also reviewed. The risks                            and benefits of the procedure and the sedation                            options and risks were discussed with the patient.                            All questions were answered, and informed consent                            was obtained. Prior Anticoagulants: The patient has                            taken Xarelto  (rivaroxaban ), last dose was 3 days                            prior to procedure. ASA Grade Assessment: III - A  patient with severe systemic disease. After                            reviewing the risks and benefits, the patient was                            deemed in satisfactory condition to undergo the                            procedure.                           After obtaining informed consent, the colonoscope                            was passed under direct vision. Throughout the                             procedure, the patient's blood pressure, pulse, and                            oxygen saturations were monitored continuously. The                            CF-HQ190L ([id number] Olympus colonoscope was                            introduced through the anus and advanced to the the                            terminal ileum, with identification of the                            appendiceal orifice and IC valve. The terminal                            ileum, ileocecal valve, appendiceal orifice, and                            rectum were photographed. The entire colon was                            examined. The colonoscopy was performed without                            difficulty. The patient tolerated the procedure                            well. The quality of the bowel preparation was good. Scope In: 11:01:10 AM Scope Out: 11:13:16 AM Scope Withdrawal Time: 0 hours 6 minutes 42 seconds  Total Procedure Duration: 0 hours 12 minutes 6 seconds  Findings:      The perianal exam findings include internal hemorrhoids that prolapse       with straining, but require manual replacement into the anal canal       (  Grade III).      Internal hemorrhoids were found during retroflexion. The hemorrhoids       were moderate.      The colon (entire examined portion) appeared normal.      The terminal ileum appeared normal.      No old or fresh blood was seen to the extent of our examination. Impression:               - Internal hemorrhoids that prolapse with                            straining, but require manual replacement into the                            anal canal (Grade III) found on perianal exam.                           - Internal hemorrhoids.                           - The entire examined colon is normal.                           - The examined portion of the ileum was normal.                           - No specimens collected.                           -  No clear source anemia on egd/colon, unless from                            hemorrhoids.                           - Intermittent bleeding highly likely from                            hemorrhoids. Moderate Sedation:      Not Applicable - Patient had care per Anesthesia. Recommendation:           - Return patient to hospital ward for ongoing care.                           - Soft diet today.                           - Continue present medications.                           [facility] will follow. Procedure Code(s):        --- Professional ---                           [id number], Colonoscopy, flexible; diagnostic, including  collection of specimen(s) by brushing or washing,                            when performed (separate procedure) Diagnosis Code(s):        --- Professional ---                           K64.2, Third degree hemorrhoids                           K92.1, Melena (includes Hematochezia)                           D50.9, Iron deficiency anemia, unspecified CPT copyright 2022 [employer]. All rights reserved. The codes documented in this report are preliminary and upon coder review may  be revised to meet current compliance requirements. Evangeline Hilts, MD 09/06/2023 11:26:05 AM This report has been signed electronically. Number of Addenda: 0

## 2023-09-06 NOTE — Anesthesia Preprocedure Evaluation (Addendum)
 Anesthesia Evaluation  Patient identified by MRN, date of birth, ID band Patient awake    Reviewed: Allergy & Precautions, NPO status , Patient's Chart, lab work & pertinent test results, reviewed documented beta blocker date and time   History of Anesthesia Complications Negative for: history of anesthetic complications  Airway Mallampati: II  TM Distance: >3 FB Neck ROM: Full    Dental  (+) Dental Advisory Given, Missing   Pulmonary sleep apnea and Continuous Positive Airway Pressure Ventilation    Pulmonary exam normal        Cardiovascular hypertension, Pt. on medications and Pt. on home beta blockers Normal cardiovascular exam+ dysrhythmias Atrial Fibrillation      Neuro/Psych negative neurological ROS  negative psych ROS   GI/Hepatic negative GI ROS, Neg liver ROS,,,  Endo/Other    Class 3 obesity Na 132   Renal/GU negative Renal ROS     Musculoskeletal  (+) Arthritis ,    Abdominal   Peds  Hematology  (+) Blood dyscrasia, anemia  INR 1.9 On xarelto     Anesthesia Other Findings   Reproductive/Obstetrics                             Anesthesia Physical Anesthesia Plan  ASA: 3  Anesthesia Plan: MAC   Post-op Pain Management: Minimal or no pain anticipated   Induction:   PONV Risk Score and Plan: 1 and Propofol  infusion and Treatment may vary due to age or medical condition  Airway Management Planned: Nasal Cannula and Natural Airway  Additional Equipment: None  Intra-op Plan:   Post-operative Plan:   Informed Consent: I have reviewed the patients History and Physical, chart, labs and discussed the procedure including the risks, benefits and alternatives for the proposed anesthesia with the patient or authorized representative who has indicated his/her understanding and acceptance.       Plan Discussed with: CRNA and Anesthesiologist  Anesthesia Plan Comments:         Anesthesia Quick Evaluation

## 2023-09-06 NOTE — Progress Notes (Signed)
 Nutrition Note  RD consulted for nutrition education regarding weight loss.  Patient not in room, having EGD/colonoscopy.  Diet advanced to dysphagia 3 following procedure. Weight loss education most effective in the outpatient setting so will place an outpatient referral to [facility].   Body mass index is 39.87 kg/m. Pt meets criteria for obesity based on current BMI.  Current diet order is Dys 3. Labs and medications reviewed. No further nutrition interventions warranted at this time. If additional nutrition issues arise, please re-consult RD.  Arna Better, MS, RD, LDN Inpatient Clinical Dietitian Contact via Secure chat

## 2023-09-07 ENCOUNTER — Encounter (HOSPITAL_COMMUNITY)
Admission: EM | Disposition: A | Discharge status: Home / Self Care | Payer: Self-pay | Source: Home / Self Care | Attending: Emergency Medicine

## 2023-09-07 ENCOUNTER — Observation Stay (HOSPITAL_BASED_OUTPATIENT_CLINIC_OR_DEPARTMENT_OTHER): Payer: Medicare (Managed Care)

## 2023-09-07 ENCOUNTER — Encounter (HOSPITAL_COMMUNITY): Payer: Self-pay | Admitting: Internal Medicine

## 2023-09-07 DIAGNOSIS — D500 Iron deficiency anemia secondary to blood loss (chronic): Secondary | ICD-10-CM | POA: Diagnosis not present

## 2023-09-07 DIAGNOSIS — M7989 Other specified soft tissue disorders: Secondary | ICD-10-CM | POA: Diagnosis not present

## 2023-09-07 HISTORY — PX: GIVENS CAPSULE STUDY: SHX5432

## 2023-09-07 LAB — CBC
HCT: 29 % — ABNORMAL LOW (ref 39.0–52.0)
Hemoglobin: 8.2 g/dL — ABNORMAL LOW (ref 13.0–17.0)
MCH: 20.6 pg — ABNORMAL LOW (ref 26.0–34.0)
MCHC: 28.3 g/dL — ABNORMAL LOW (ref 30.0–36.0)
MCV: 72.9 fL — ABNORMAL LOW (ref 80.0–100.0)
Platelets: 278 10*3/uL (ref 150–400)
RBC: 3.98 MIL/uL — ABNORMAL LOW (ref 4.22–5.81)
RDW: 21.1 % — ABNORMAL HIGH (ref 11.5–15.5)
WBC: 9.4 10*3/uL (ref 4.0–10.5)
nRBC: 0.2 % (ref 0.0–0.2)

## 2023-09-07 LAB — TYPE AND SCREEN
ABO/RH(D): O POS
Antibody Screen: NEGATIVE
Unit division: 0
Unit division: 0

## 2023-09-07 LAB — BPAM RBC
Blood Product Expiration Date: 202506292359
Blood Product Expiration Date: 202506282359
ISSUE DATE / TIME: 202505271406
ISSUE DATE / TIME: 202505241606
Unit Type and Rh: 5100
Unit Type and Rh: 5100

## 2023-09-07 LAB — BASIC METABOLIC PANEL WITH GFR
Anion gap: 8 (ref 5–15)
BUN: 13 mg/dL (ref 8–23)
CO2: 23 mmol/L (ref 22–32)
Calcium: 8.4 mg/dL — ABNORMAL LOW (ref 8.9–10.3)
Chloride: 106 mmol/L (ref 98–111)
Creatinine, Ser: 1.35 mg/dL — ABNORMAL HIGH (ref 0.61–1.24)
GFR, Estimated: 58 mL/min — ABNORMAL LOW (ref >60)
Glucose, Bld: 109 mg/dL — ABNORMAL HIGH (ref 70–99)
Potassium: 4 mmol/L (ref 3.5–5.1)
Sodium: 137 mmol/L (ref 135–145)

## 2023-09-07 LAB — MAGNESIUM: Magnesium: 2 mg/dL (ref 1.7–2.4)

## 2023-09-07 SURGERY — IMAGING PROCEDURE, GI TRACT, INTRALUMINAL, VIA CAPSULE

## 2023-09-07 MED ORDER — DICLOFENAC SODIUM 1 % EX GEL
2.0000 g | Freq: Four times a day (QID) | CUTANEOUS | Status: DC
  Administered 2023-09-07 – 2023-09-08 (×4): 2 g via TOPICAL
  Filled 2023-09-07: qty 100

## 2023-09-07 NOTE — Progress Notes (Signed)
   09/07/23 1939  BiPAP/CPAP/SIPAP  BiPAP/CPAP/SIPAP Pt Type Adult  Reason BIPAP/CPAP not in use Non-compliant (Pt refusing cpap for the night)

## 2023-09-07 NOTE — Progress Notes (Signed)
 PROGRESS NOTE  Daryl [name]  DOB: 11/14/1955  PCP: Juvenal Opoka YQM:[id number]  DOA: 09/03/2023  LOS: 0 days  Hospital Day: 5  Brief narrative: Daryl [name] is a 68 y.o. male with PMH significant for obesity, OSA on CPAP, HTN, A-fib on flecainide /Xarelto , constipation, hemorrhoids who has ongoing intermittent BRBPR for the last 3 to 4 years. 5/24, patient presented to ED with complaint dizziness in the setting of chronic bleeding. Workup in the ED showed hemoglobin low at 6.6 CT abdomen and pelvis did not show any extravasation of blood but showed diverticulosis without evidence of diverticulitis.   1 unit of PRBC transfusion was given. Admitted to [facility] Xarelto  was held GI and cardiology were consulted  Subjective: Patient was seen and examined this morning. Lying down in bed.  Not in distress. Applying ice at the site of superficial thrombophlebitis on right AC Capsule endoscopy started this morning  Assessment and plan: Acute on chronic GI bleeding H/o diverticulosis, hemorrhoids Presented with ongoing intermittent BRBPR, low hemoglobin, in the setting of Xarelto  use Also has history of diverticulosis and hemorrhoids 5/27, underwent EGD and colonoscopy today.  Per report, no other finding except for grade 3 hemorrhoids.  Potential source of bleeding.  But given the severity of anemia, GI has started capsule endoscopy to rule out any small intestinal bleeding. Diet plan per capsule endoscopy protocol  Acute on chronic blood loss anemia Severe iron deficiency Presented with a hemoglobin of 6.6, secondary to GI bleeding. Hemoglobin trend as below. Improved today to 8.2 after 1 unit transfusion yesterday 2 units of PRBC transfusion given so far this hospitalization.   Patient also received total of 400 mg of IV iron this hospitalization.  Would benefit from outpatient follow-up for further infusion.  Will avoid oral iron supplement because of severe constipation.   Xarelto  remains  on hold for now Recent Labs    09/04/23 0618 09/04/23 0816 09/05/23 0522 09/06/23 0457 09/06/23 1919 09/07/23 0529  HGB 7.1*  --  7.2* 7.0* 8.5* 8.2*  MCV 68.7*  --  70.7* 71.2*  --  72.9*  VITAMINB12  --  554  --   --   --   --   FOLATE  --  12.8  --   --   --   --   FERRITIN  --  3*  --   --   --   --   TIBC  --  573*  --   --   --   --   IRON  --  15*  --   --   --   --   RETICCTPCT  --  1.4  --   --   --   --    Paroxysmal A-fib Currently in sinus rhythm.   Current flecainide , Toprol -XL  Chronically anticoagulated with Xarelto , currently on hold.  Superficial thrombophlebitis Patient has an area of superficial thrombophlebitis on the right AC at the site of previous IV access.  Confirmed with ultrasound.  No DVT. Ordered local diclofenac gel application.  Can continue cold compression.  Essential hypertension Blood pressure controlled on Toprol .   AKI Creatinine increased to 1.35 today. Not on any diuretics for nephrotoxic medicines. Continue to monitor Recent Labs    09/13/22 1027 09/02/23 1008 09/03/23 1050 09/04/23 0618 09/05/23 0522 09/06/23 0457 09/07/23 0529  BUN 17 20 21 16 13 9 13   CREATININE 1.14 1.67* 1.59* 1.24 1.19 1.15 1.35*    OSA on CPAP Continue CPAP at night  Allergies Antihistamine, Flonase  and eardrops  Chronic constipation Scheduled and as needed bowel regimen  Obesity 3 Body mass index is 39.88 kg/m. Patient has been advised to make an attempt to improve diet and exercise patterns to aid in weight loss.  mobility: Encourage ambulation  Goals of care   Code Status: Full Code     DVT prophylaxis:  SCDs Start: 09/03/23 1429   Antimicrobials: None Fluid: None Consultants: GI Family Communication: None at bedside  Status: Observation Level of care:  Telemetry   Patient is from: Home Needs to continue in-hospital care: Ongoing capsule endoscopy today Anticipated d/c to: Hopefully home in 1 to 2 days    Diet:   Diet Order             Diet NPO time specified  Diet effective midnight                   Scheduled Meds:  diclofenac  Sodium  2 g Topical QID   flecainide   100 mg Oral BID   fluticasone   2 spray Each Nare Daily   loratadine   10 mg Oral Daily   metoprolol  succinate  25 mg Oral BID   NEOMYCIN -POLYMYXIN-HYDROCORTISONE  1 drop Both EARS Q8H   pantoprazole   40 mg Oral BID AC   pneumococcal 20-valent conjugate vaccine  0.5 mL Intramuscular Tomorrow-1000    PRN meds: acetaminophen  **OR** acetaminophen , albuterol , melatonin, ondansetron  **OR** ondansetron  (ZOFRAN ) IV   Infusions:     Antimicrobials: Anti-infectives (From admission, onward)    None       Objective: Vitals:   09/06/23 1943 09/07/23 0427  BP: (!) 140/81 114/64  Pulse: 71 62  Resp: 17 18  Temp: 98.4 F (36.9 C) 97.7 F (36.5 C)  SpO2: 98% 92%    Intake/Output Summary (Last 24 hours) at 09/07/2023 1127 Last data filed at 09/06/2023 1708 Gross per 24 hour  Intake 370.69 ml  Output --  Net 370.69 ml   Filed Weights   09/04/23 0051 09/06/23 1004 09/07/23 0946  Weight: 126.7 kg 122.5 kg 122.5 kg   Weight change:  Body mass index is 39.88 kg/m.   Physical Exam: General exam: Pleasant, elderly male of Hispanic origin.  Not in distress Skin: No rashes, lesions or ulcers. HEENT: Atraumatic, normocephalic, no obvious bleeding Lungs: Clear to auscultation bilaterally,  CVS: S1, S2, no murmur,   GI/Abd: Soft, nontender, nondistended, bowel sound present,   CNS: Alert, awake, oriented x 3 Psychiatry: Mood appropriate,  Extremities: No pedal edema, no calf tenderness, hard baseball size area on right AC.  Data Review: I have personally reviewed the laboratory data and studies available.  F/u labs ordered Unresulted Labs (From admission, onward)     Start     Ordered   09/08/23 0500  Basic metabolic panel with GFR  Daily,   R     Question:  Specimen collection method  Answer:  Lab=Lab collect    09/07/23 1127   09/08/23 0500  CBC with Differential/Platelet  Daily,   R     Question:  Specimen collection method  Answer:  Lab=Lab collect   09/07/23 1127           Signed, Hoyt Macleod, MD [facility] 09/07/2023

## 2023-09-07 NOTE — Progress Notes (Signed)
 Right upper extremity venous duplex has been completed. Preliminary results can be found in CV Proc through chart review.  Results were given to Dr. Gwynneth Lessen.  09/07/23 10:01 AM Birda Buffy RVT

## 2023-09-07 NOTE — Progress Notes (Signed)
 Tele reviewed, maintaining NSR. D/w Dr. Stann Earnest - no new recs. Please call with questions.  [facility] will sign off.   Medication Recommendations:  Please see rounding note from yesterday for recommendations - avoid Xarelto  at DC. Otherwise continuing flecainide , metoprolol  Follow up as an outpatient:  F/u with afib clinic has been moved up to 6/10 and outlined on AVS.

## 2023-09-07 NOTE — Plan of Care (Signed)
  Problem: Education: Goal: Knowledge of General Education information will improve Description: Including pain rating scale, medication(s)/side effects and non-pharmacologic comfort measures 09/07/2023 1632 by Geralynn Knife, LPN Outcome: Progressing 09/07/2023 1632 by Geralynn Knife, LPN Outcome: Progressing   Problem: Health Behavior/Discharge Planning: Goal: Ability to manage health-related needs will improve 09/07/2023 1632 by Geralynn Knife, LPN Outcome: Progressing 09/07/2023 1632 by Geralynn Knife, LPN Outcome: Progressing   Problem: Clinical Measurements: Goal: Ability to maintain clinical measurements within normal limits will improve 09/07/2023 1632 by Geralynn Knife, LPN Outcome: Progressing 09/07/2023 1632 by Geralynn Knife, LPN Outcome: Progressing Goal: Will remain free from infection 09/07/2023 1632 by Geralynn Knife, LPN Outcome: Progressing 09/07/2023 1632 by Geralynn Knife, LPN Outcome: Progressing Goal: Diagnostic test results will improve 09/07/2023 1632 by Geralynn Knife, LPN Outcome: Progressing 09/07/2023 1632 by Geralynn Knife, LPN Outcome: Progressing Goal: Respiratory complications will improve 09/07/2023 1632 by Geralynn Knife, LPN Outcome: Progressing 09/07/2023 1632 by Geralynn Knife, LPN Outcome: Progressing Goal: Cardiovascular complication will be avoided 09/07/2023 1632 by Geralynn Knife, LPN Outcome: Progressing 09/07/2023 1632 by Geralynn Knife, LPN Outcome: Progressing   Problem: Activity: Goal: Risk for activity intolerance will decrease 09/07/2023 1632 by Geralynn Knife, LPN Outcome: Progressing 09/07/2023 1632 by Geralynn Knife, LPN Outcome: Progressing   Problem: Nutrition: Goal: Adequate nutrition will be maintained 09/07/2023 1632 by Geralynn Knife, LPN Outcome: Progressing 09/07/2023 1632 by Geralynn Knife, LPN Outcome: Progressing   Problem: Coping: Goal: Level of anxiety will decrease 09/07/2023 1632 by Geralynn Knife, LPN Outcome:  Progressing 09/07/2023 1632 by Geralynn Knife, LPN Outcome: Progressing   Problem: Elimination: Goal: Will not experience complications related to bowel motility Outcome: Progressing Goal: Will not experience complications related to urinary retention Outcome: Progressing   Problem: Pain Managment: Goal: General experience of comfort will improve and/or be controlled Outcome: Progressing   Problem: Safety: Goal: Ability to remain free from injury will improve Outcome: Progressing   Problem: Skin Integrity: Goal: Risk for impaired skin integrity will decrease Outcome: Progressing

## 2023-09-07 NOTE — Progress Notes (Signed)
 Subjective: No abdominal pain. No blood in stool.  Objective: Vital signs in last 24 hours: Temp:  [97.6 F (36.4 C)-98.8 F (37.1 C)] 97.7 F (36.5 C) (05/28 0427) Pulse Rate:  [62-75] 62 (05/28 0427) Resp:  [12-24] 18 (05/28 0427) BP: (70-140)/(35-89) 114/64 (05/28 0427) SpO2:  [92 %-100 %] 92 % (05/28 0427) Weight:  [122.5 kg] 122.5 kg (05/28 0946) Weight change:  Last BM Date : 09/06/23  PE: GEN:  NAD NEURO:  No encephalopathy  Lab Results: CBC    Component Value Date/Time   WBC 9.4 09/07/2023 0529   RBC 3.98 (L) 09/07/2023 0529   HGB 8.2 (L) 09/07/2023 0529   HGB 6.6 (LL) 09/02/2023 1008   HCT 29.0 (L) 09/07/2023 0529   HCT 24.5 (L) 09/02/2023 1008   PLT 278 09/07/2023 0529   PLT 437 09/02/2023 1008   MCV 72.9 (L) 09/07/2023 0529   MCV 68 (L) 09/02/2023 1008   MCH 20.6 (L) 09/07/2023 0529   MCHC 28.3 (L) 09/07/2023 0529   RDW 21.1 (H) 09/07/2023 0529   RDW 16.2 (H) 09/02/2023 1008   LYMPHSABS 1.9 09/03/2023 1050   MONOABS 0.6 09/03/2023 1050   EOSABS 0.2 09/03/2023 1050   BASOSABS 0.1 09/03/2023 1050  CMP     Component Value Date/Time   NA 137 09/07/2023 0529   NA 140 09/02/2023 1008   K 4.0 09/07/2023 0529   CL 106 09/07/2023 0529   CO2 23 09/07/2023 0529   GLUCOSE 109 (H) 09/07/2023 0529   BUN 13 09/07/2023 0529   BUN 20 09/02/2023 1008   CREATININE 1.35 (H) 09/07/2023 0529   CALCIUM 8.4 (L) 09/07/2023 0529   PROT 7.7 09/03/2023 1050   PROT 7.1 12/09/2021 1029   ALBUMIN 3.5 09/06/2023 0457   ALBUMIN 4.3 12/09/2021 1029   AST 18 09/03/2023 1050   ALT 11 09/03/2023 1050   ALKPHOS 70 09/03/2023 1050   BILITOT 0.8 09/03/2023 1050   BILITOT 0.5 12/09/2021 1029   EGFR 45 (L) 09/02/2023 1008   GFRNONAA 58 (L) 09/07/2023 0529    Assessment:   Hematochezia.  Resolved.  Possibly hemorrhoidal. Anemia.  No clear finding on endoscopy or colonoscopy.  Plan:   Capsule endoscopy diet. Diet ok per post-capsule orders timeline. Plan capsule read  hopefully tomorrow afternoon and if no significant abnormality, likely discharge tomorrow. Follow CBC; transfuse as needed. Anticoagulation on hold. [facility] will follow.   [name] 09/07/2023, 10:37 AM   Cell [phone] If no answer or after 5 PM call [phone]

## 2023-09-08 ENCOUNTER — Encounter (HOSPITAL_COMMUNITY): Payer: Self-pay | Admitting: Gastroenterology

## 2023-09-08 DIAGNOSIS — D500 Iron deficiency anemia secondary to blood loss (chronic): Secondary | ICD-10-CM | POA: Diagnosis not present

## 2023-09-08 LAB — BASIC METABOLIC PANEL WITH GFR
Anion gap: 8 (ref 5–15)
BUN: 14 mg/dL (ref 8–23)
CO2: 24 mmol/L (ref 22–32)
Calcium: 8.4 mg/dL — ABNORMAL LOW (ref 8.9–10.3)
Chloride: 103 mmol/L (ref 98–111)
Creatinine, Ser: 1.31 mg/dL — ABNORMAL HIGH (ref 0.61–1.24)
GFR, Estimated: 60 mL/min — ABNORMAL LOW (ref >60)
Glucose, Bld: 109 mg/dL — ABNORMAL HIGH (ref 70–99)
Potassium: 3.7 mmol/L (ref 3.5–5.1)
Sodium: 135 mmol/L (ref 135–145)

## 2023-09-08 LAB — CBC WITH DIFFERENTIAL/PLATELET
Abs Immature Granulocytes: 0.06 10*3/uL (ref 0.00–0.07)
Basophils Absolute: 0.1 10*3/uL (ref 0.0–0.1)
Basophils Relative: 1 %
Eosinophils Absolute: 0.4 10*3/uL (ref 0.0–0.5)
Eosinophils Relative: 4 %
HCT: 28.1 % — ABNORMAL LOW (ref 39.0–52.0)
Hemoglobin: 7.9 g/dL — ABNORMAL LOW (ref 13.0–17.0)
Immature Granulocytes: 1 %
Lymphocytes Relative: 25 %
Lymphs Abs: 2.2 10*3/uL (ref 0.7–4.0)
MCH: 20.7 pg — ABNORMAL LOW (ref 26.0–34.0)
MCHC: 28.1 g/dL — ABNORMAL LOW (ref 30.0–36.0)
MCV: 73.6 fL — ABNORMAL LOW (ref 80.0–100.0)
Monocytes Absolute: 0.7 10*3/uL (ref 0.1–1.0)
Monocytes Relative: 8 %
Neutro Abs: 5.5 10*3/uL (ref 1.7–7.7)
Neutrophils Relative %: 61 %
Platelets: 304 10*3/uL (ref 150–400)
RBC: 3.82 MIL/uL — ABNORMAL LOW (ref 4.22–5.81)
RDW: 22.3 % — ABNORMAL HIGH (ref 11.5–15.5)
WBC: 8.9 10*3/uL (ref 4.0–10.5)
nRBC: 0 % (ref 0.0–0.2)

## 2023-09-08 MED ORDER — CARBAMIDE PEROXIDE 6.5 % OT SOLN
5.0000 [drp] | Freq: Two times a day (BID) | OTIC | Status: DC
  Administered 2023-09-08: 5 [drp] via OTIC
  Filled 2023-09-08: qty 15

## 2023-09-08 MED ORDER — CARBAMIDE PEROXIDE 6.5 % OT SOLN: 5.0000 [drp] | Freq: Two times a day (BID) | OTIC | 0 refills | Status: AC

## 2023-09-08 MED ORDER — DICLOFENAC SODIUM 1 % EX GEL: 2.0000 g | Freq: Four times a day (QID) | CUTANEOUS | 0 refills | Status: DC

## 2023-09-08 NOTE — Discharge Summary (Signed)
 Physician Discharge Summary  Daryl [name]:[id number] DOB: 08-03-1955 DOA: 09/03/2023  PCP: Pcp, No  Admit date: 09/03/2023 Discharge date: 09/08/2023  Admitted From: Home Discharge disposition: Home  Recommendations at discharge:  Keep Xarelto  on hold till follow-up with cardiology and GI as an outpatient Continue to apply diclofenac  gel and cold compression on right antecubital fossa firmness   Brief narrative: Daryl [name] is a 68 y.o. male with PMH significant for obesity, OSA on CPAP, HTN, A-fib on flecainide /Xarelto , constipation, hemorrhoids who has ongoing intermittent BRBPR for the last 3 to 4 years. 5/24, patient presented to ED with complaint dizziness in the setting of chronic bleeding. Workup in the ED showed hemoglobin low at 6.6 CT abdomen and pelvis did not show any extravasation of blood but showed diverticulosis without evidence of diverticulitis.   1 unit of PRBC transfusion was given. Admitted to [facility] Xarelto  was held GI and cardiology were consulted  Subjective: Patient was seen and examined this morning. Sitting up on recliner.  Not in distress. Seen by GI.  Completed capsule endoscopy yesterday, pending report. Recommended discharge to home and follow-up as an outpatient.  Xarelto  to remain on hold  Hospital course: Acute on chronic GI bleeding H/o diverticulosis, hemorrhoids Presented with ongoing intermittent BRBPR, low hemoglobin, in the setting of Xarelto  use Also has history of diverticulosis and hemorrhoids 5/27, underwent EGD and colonoscopy today.  Per report, no other finding except for grade 3 hemorrhoids.  Potential source of bleeding.  But given the severity of anemia,  5/28, capsule endoscopy done to rule out small intestinal bleeding. Pending report at this time.  Per GI, discharge home and follow-up as an outpatient for report Xarelto  to remain on hold  Acute on chronic blood loss anemia Severe iron  deficiency Presented with a  hemoglobin of 6.6, secondary to GI bleeding. Hemoglobin trend as below. Improved today to 8.2 after 1 unit transfusion yesterday 2 units of PRBC transfusion given so far this hospitalization.   Patient also received total of 400 mg of IV iron  this hospitalization.  Would benefit from outpatient follow-up for further infusion.  Will avoid oral iron  supplement because of severe constipation.   Xarelto  remains on hold for now Recent Labs    09/04/23 0816 09/05/23 0522 09/06/23 0457 09/06/23 1919 09/07/23 0529 09/08/23 0527  HGB  --  7.2* 7.0* 8.5* 8.2* 7.9*  MCV  --  70.7* 71.2*  --  72.9* 73.6*  VITAMINB12 554  --   --   --   --   --   FOLATE 12.8  --   --   --   --   --   FERRITIN 3*  --   --   --   --   --   TIBC 573*  --   --   --   --   --   IRON  15*  --   --   --   --   --   RETICCTPCT 1.4  --   --   --   --   --    Paroxysmal A-fib Currently in sinus rhythm.   Current flecainide , Toprol -XL  Was chronically anticoagulated with Xarelto .  Per cardiology and GI recommendation, to keep Xarelto  on hold till follow-up with them as an outpatient.  Patient has an appointment with cardiology on 6/9.  Superficial thrombophlebitis Patient has an area of superficial thrombophlebitis on the right AC at the site of previous IV access.  Confirmed with ultrasound.  No DVT. Ordered local diclofenac gel application.  Can continue cold compression. Improving in size.  Essential hypertension Blood pressure controlled on Toprol .   AKI Creatinine increased to 1.35 on 5/28.. Not on any diuretics for nephrotoxic medicines. Slightly better today.  Continue to monitor as an outpatient. Recent Labs    09/13/22 1027 09/02/23 1008 09/03/23 1050 09/04/23 0618 09/05/23 0522 09/06/23 0457 09/07/23 0529 09/08/23 0527  BUN 17 20 21 16 13 9 13 14   CREATININE 1.14 1.67* 1.59* 1.24 1.19 1.15 1.35* 1.31*    OSA on CPAP Continue CPAP at night    Allergies Antihistamine, Flonase and  eardrops  Chronic constipation Scheduled and as needed bowel regimen  Ear fullness Complains of chronic issues with earwax in.  Debrox suggested Patient also uses Cortisporin ear solution.  Obesity 3 Body mass index is 39.88 kg/m. Patient has been advised to make an attempt to improve diet and exercise patterns to aid in weight loss.  mobility: Encourage ambulation  Goals of care   Code Status: Full Code   Diet:  Diet Order             Diet general           Diet regular Room service appropriate? Yes; Fluid consistency: Thin  Diet effective now                   Nutritional status:  Body mass index is 39.88 kg/m.       Wounds:  -    Discharge Exam:   Vitals:   09/07/23 0946 09/07/23 1918 09/08/23 0455 09/08/23 0948  BP:  (!) 160/86 137/85 (!) 146/90  Pulse:  66 64 63  Resp:  20 16   Temp:  98.9 F (37.2 C) 98 F (36.7 C)   TempSrc:  Oral    SpO2:  98% 94% 95%  Weight: 122.5 kg     Height: 5\' 9"  (1.753 m)       Body mass index is 39.88 kg/m.  General exam: Pleasant, elderly male of Hispanic origin.  Not in distress Skin: No rashes, lesions or ulcers. HEENT: Atraumatic, normocephalic, no obvious bleeding Lungs: Clear to auscultation bilaterally,  CVS: S1, S2, no murmur,   GI/Abd: Soft, nontender, nondistended, bowel sound present,   CNS: Alert, awake, oriented x 3 Psychiatry: Mood appropriate,  Extremities: No pedal edema, no calf tenderness, improving right antecubital fossa firmness  Follow ups:    Follow-up Information     Fenton, Clint R, PA Follow up.   Specialty: Cardiology Why: [facility] **Now located at [address] We moved up your follow-up appointment to June 10th at 3:30pm. Arrive by 3:15pm to check in. Contact information: [address] [place] [address] [phone]         [facility] Follow up.   Contact information: [address] [place]  [address] [phone]                Discharge Instructions:   Discharge Instructions     Amb Referral to Intravenous Iron Therapy   Complete by: As directed    You have been referred to [facility] Infusion team for IV Iron Infusions. The infusion pharmacy team will reach out to you with appointment information.    Primary Diagnosis Code for IV Iron: D50.9 - Iron deficiency Anemia   Secondary diagnosis code for IV iron: Other   Comment: Potential acute on chronic blood  loss on DOAC   Amb Referral to Nutrition and Diabetic Education   Complete by: As directed    Call MD for:  difficulty breathing, headache or visual disturbances   Complete by: As directed    Call MD for:  extreme fatigue   Complete by: As directed    Call MD for:  hives   Complete by: As directed    Call MD for:  persistant dizziness or light-headedness   Complete by: As directed    Call MD for:  persistant nausea and vomiting   Complete by: As directed    Call MD for:  severe uncontrolled pain   Complete by: As directed    Call MD for:  temperature >100.4   Complete by: As directed    Diet general   Complete by: As directed    Discharge instructions   Complete by: As directed    Recommendations at discharge:   Keep Xarelto  on hold till follow-up with cardiology and GI as an outpatient  Continue to apply diclofenac  gel and cold compression on right antecubital fossa firmness  General discharge instructions: Follow with Primary MD Pcp, No in 7 days  Please request your PCP  to go over your hospital tests, procedures, radiology results at the follow up. Please get your medicines reviewed and adjusted.  Your PCP may decide to repeat certain labs or tests as needed. Do not drive, operate heavy machinery, perform activities at heights, swimming or participation in water activities or provide baby sitting services if your were admitted for syncope or siezures until you  have seen by Primary MD or a Neurologist and advised to do so again. [place]  Controlled Substance Reporting System database was reviewed. Do not drive, operate heavy machinery, perform activities at heights, swim, participate in water activities or provide baby-sitting services while on medications for pain, sleep and mood until your outpatient physician has reevaluated you and advised to do so again.  You are strongly recommended to comply with the dose, frequency and duration of prescribed medications. Activity: As tolerated with Full fall precautions use walker/cane & assistance as needed Avoid using any recreational substances like cigarette, tobacco, alcohol, or non-prescribed drug. If you experience worsening of your admission symptoms, develop shortness of breath, life threatening emergency, suicidal or homicidal thoughts you must seek medical attention immediately by calling 911 or calling your MD immediately  if symptoms less severe. You must read complete instructions/literature along with all the possible adverse reactions/side effects for all the medicines you take and that have been prescribed to you. Take any new medicine only after you have completely understood and accepted all the possible adverse reactions/side effects.  Wear Seat belts while driving. You were cared for by a hospitalist during your hospital stay. If you have any questions about your discharge medications or the care you received while you were in the hospital after you are discharged, you can call the unit and ask to speak with the hospitalist or the covering physician. Once you are discharged, your primary care physician will handle any further medical issues. Please note that NO REFILLS for any discharge medications will be authorized once you are discharged, as it is imperative that you return to your primary care physician (or establish a relationship with a primary care physician if you do not have one).    Increase activity slowly   Complete by: As directed        Discharge Medications:   Allergies as of 09/08/2023  No Known Allergies      Medication List     STOP taking these medications    Xarelto  20 MG Tabs tablet Generic drug: rivaroxaban        TAKE these medications    acetaminophen 500 MG tablet Commonly known as: TYLENOL Take 1,000 mg by mouth as needed for mild pain (pain score 1-3), moderate pain (pain score 4-6) or headache.   Afrin 12 Hour 0.05 % nasal spray Generic drug: oxymetazoline Place 1 spray into both nostrils 2 (two) times daily as needed for congestion.   carbamide peroxide 6.5 % OTIC solution Commonly known as: DEBROX Place 5 drops into both ears 2 (two) times daily.   diclofenac Sodium 1 % Gel Commonly known as: VOLTAREN Apply 2 g topically 4 (four) times daily.   famotidine 20 MG tablet Commonly known as: PEPCID Take 20 mg by mouth at bedtime as needed for heartburn or indigestion.   flecainide  100 MG tablet Commonly known as: TAMBOCOR  TAKE ONE TABLET BY MOUTH TWICE DAILY   lisinopril 20 MG tablet Commonly known as: ZESTRIL Take 1 tablet (20 mg total) by mouth daily.   metoprolol  succinate 25 MG 24 hr tablet Commonly known as: TOPROL -XL Take 1 tablet (25 mg total) by mouth 2 (two) times daily.   NEOMYCIN -POLYMYXIN-HYDROCORTISONE 1 % Soln OTIC solution Commonly known as: CORTISPORIN Place 1 drop into both ears daily as needed (itching).   PRESCRIPTION MEDICATION See admin instructions. BiPAP- At bedtime   sodium chloride  0.65 % Soln nasal spray Commonly known as: OCEAN Place 1 spray into both nostrils as needed for congestion.   triamcinolone cream 0.5 % Commonly known as: KENALOG Apply 1 Application topically 2 (two) times daily as needed (for itching).         The results of significant diagnostics from this hospitalization (including imaging, microbiology, ancillary and laboratory) are listed below for reference.     Procedures and Diagnostic Studies:   ECHOCARDIOGRAM COMPLETE Result Date: 09/04/2023    ECHOCARDIOGRAM REPORT   Patient Name:   EMMETT BRACKNELL Date of Exam: 09/04/2023 Medical Rec #:  [id number]      Height:       69.0 in Accession #:    [id number]     Weight:       279.4 lb Date of Birth:  1955/05/18      BSA:          2.381 m Patient Age:    67 years       BP:           138/76 mmHg Patient Gender: M              HR:           62 bpm. Exam Location:  Inpatient Procedure: 2D Echo, Color Doppler and Cardiac Doppler (Both Spectral and Color            Flow Doppler were utilized during procedure). Indications:    I48.91* Unspeicified atrial fibrillation  History:        Patient has prior history of Echocardiogram examinations, most                 recent 07/10/2020.  Sonographer:    Andrena Bang Referring Phys: [id number] CHRISTOPHER L SCHUMANN IMPRESSIONS  1. Left ventricular ejection fraction, by estimation, is 60 to 65%. The left ventricle has normal function. The left ventricle has no regional wall motion abnormalities. There is moderate left ventricular hypertrophy. Left ventricular diastolic parameters were  grossly normal.  2. Right ventricular systolic function is normal. The right ventricular size is normal. Tricuspid regurgitation signal is inadequate for assessing PA pressure.  3. Left atrial size was mildly dilated.  4. The mitral valve is grossly normal. Mild to moderate mitral valve regurgitation. No evidence of mitral stenosis.  5. The aortic valve is tricuspid. Aortic valve regurgitation is trivial. No aortic stenosis is present.  6. The inferior vena cava is dilated in size with <50% respiratory variability, suggesting right atrial pressure of 15 mmHg. FINDINGS  Left Ventricle: Left ventricular ejection fraction, by estimation, is 60 to 65%. The left ventricle has normal function. The left ventricle has no regional wall motion abnormalities. The left ventricular internal cavity size was normal in size.  There is  moderate left ventricular hypertrophy. Left ventricular diastolic parameters were normal. Right Ventricle: The right ventricular size is normal. No increase in right ventricular wall thickness. Right ventricular systolic function is normal. Tricuspid regurgitation signal is inadequate for assessing PA pressure. Left Atrium: Left atrial size was mildly dilated. Right Atrium: Right atrial size was normal in size. Pericardium: There is no evidence of pericardial effusion. Mitral Valve: The mitral valve is grossly normal. Mild to moderate mitral valve regurgitation. No evidence of mitral valve stenosis. Tricuspid Valve: The tricuspid valve is normal in structure. Tricuspid valve regurgitation is trivial. No evidence of tricuspid stenosis. Aortic Valve: The aortic valve is tricuspid. Aortic valve regurgitation is trivial. No aortic stenosis is present. Aortic valve mean gradient measures 5.0 mmHg. Aortic valve peak gradient measures 9.9 mmHg. Aortic valve area, by VTI measures 2.85 cm. Pulmonic Valve: The pulmonic valve was normal in structure. Pulmonic valve regurgitation is trivial. No evidence of pulmonic stenosis. Aorta: The aortic root is normal in size and structure. Venous: The inferior vena cava is dilated in size with less than 50% respiratory variability, suggesting right atrial pressure of 15 mmHg. IAS/Shunts: The interatrial septum was not well visualized.  LEFT VENTRICLE PLAX 2D LVIDd:         4.60 cm      Diastology LVIDs:         2.30 cm      LV e' medial:    8.58 cm/s LV PW:         1.40 cm      LV E/e' medial:  12.5 LV IVS:        1.40 cm      LV e' lateral:   18.80 cm/s LVOT diam:     2.40 cm      LV E/e' lateral: 5.7 LV SV:         94 LV SV Index:   39 LVOT Area:     4.52 cm  LV Volumes (MOD) LV vol d, MOD A2C: 140.0 ml LV vol d, MOD A4C: 166.0 ml LV vol s, MOD A2C: 64.3 ml LV vol s, MOD A4C: 58.9 ml LV SV MOD A2C:     75.7 ml LV SV MOD A4C:     166.0 ml LV SV MOD BP:      93.4 ml RIGHT  VENTRICLE RV S prime:     10.10 cm/s TAPSE (M-mode): 2.1 cm LEFT ATRIUM             Index LA diam:        5.20 cm 2.18 cm/m LA Vol (A2C):   92.4 ml 38.80 ml/m LA Vol (A4C):   78.4 ml 32.92 ml/m LA Biplane Vol: 90.5 ml 38.00 ml/m  AORTIC VALVE AV Area (Vmax):    2.77 cm AV Area (Vmean):   2.63 cm AV Area (VTI):     2.85 cm AV Vmax:           157.00 cm/s AV Vmean:          102.000 cm/s AV VTI:            0.329 m AV Peak Grad:      9.9 mmHg AV Mean Grad:      5.0 mmHg LVOT Vmax:         96.00 cm/s LVOT Vmean:        59.400 cm/s LVOT VTI:          0.207 m LVOT/AV VTI ratio: 0.63  AORTA Ao Asc diam: 3.40 cm MITRAL VALVE MV Area (PHT): 3.99 cm     SHUNTS MV Decel Time: 190 msec     Systemic VTI:  0.21 m MR Peak grad: 107.7 mmHg    Systemic Diam: 2.40 cm MR Mean grad: 62.0 mmHg MR Vmax:      519.00 cm/s MR Vmean:     361.0 cm/s MV E velocity: 107.00 cm/s MV A velocity: 75.80 cm/s MV E/A ratio:  1.41 Grady Lawman MD Electronically signed by Grady Lawman MD Signature Date/Time: 09/04/2023/2:10:52 PM    Final    CT ANGIO GI BLEED Result Date: 09/03/2023 CLINICAL DATA:  Hematochezia EXAM: CTA ABDOMEN AND PELVIS WITHOUT AND WITH CONTRAST TECHNIQUE: Multidetector CT imaging of the abdomen and pelvis was performed using the standard protocol during bolus administration of intravenous contrast. Multiplanar reconstructed images and MIPs were obtained and reviewed to evaluate the vascular anatomy. RADIATION DOSE REDUCTION: This exam was performed according to the departmental dose-optimization program which includes automated exposure control, adjustment of the mA and/or kV according to patient size and/or use of iterative reconstruction technique. CONTRAST:  80mL OMNIPAQUE IOHEXOL 350 MG/ML SOLN COMPARISON:  None Available. FINDINGS: VASCULAR No evidence of active arterial extravasation into the GI tract. No abdominal aortic aneurysm or significant vascular stenosis. Review of the MIP images confirms the above  findings. NON-VASCULAR Lower chest: No acute abnormality. Hepatobiliary: No focal liver abnormality is seen. No gallstones, gallbladder wall thickening, or biliary dilatation. Pancreas: Unremarkable. No pancreatic ductal dilatation or surrounding inflammatory changes. Spleen: Normal in size without focal abnormality. Adrenals/Urinary Tract: Nothing significant. No hydronephrosis or significant nephrolithiasis. Stomach/Bowel: Stomach is within normal limits. No evidence of appendicitis. No evidence of bowel wall thickening, distention, or inflammatory changes. Scattered colonic diverticulosis. Lymphatic: No significant vascular findings are present. No enlarged abdominal or pelvic lymph nodes. Reproductive: Prostate is unremarkable. Other: No abdominal wall hernia or abnormality. No abdominopelvic ascites. Musculoskeletal: No acute or significant osseous findings. IMPRESSION: 1. No evidence of active arterial extravasation into the GI tract. 2. Scattered colonic diverticulosis without evidence of diverticulitis. Electronically Signed   By: Reagan Camera M.D.   On: 09/03/2023 13:48     Labs:   Basic Metabolic Panel: Recent Labs  Lab 09/04/23 0618 09/05/23 0522 09/06/23 0457 09/07/23 0529 09/08/23 0527  NA 136 135 132* 137 135  K 3.9 4.0 3.8 4.0 3.7  CL 102 101 102 106 103  CO2 25 25 24 23 24   GLUCOSE 98 97 92 109* 109*  BUN 16 13 9 13 14   CREATININE 1.24 1.19 1.15 1.35* 1.31*  CALCIUM 8.7* 8.7* 8.3* 8.4* 8.4*  MG  --   --  2.0 2.0  --   PHOS  --   --  3.2  --   --    GFR Estimated Creatinine Clearance: 70.7 mL/min (A) (by C-G formula based on SCr of 1.31 mg/dL (H)). Liver Function Tests: Recent Labs  Lab 09/03/23 1050 09/06/23 0457  AST 18  --   ALT 11  --   ALKPHOS 70  --   BILITOT 0.8  --   PROT 7.7  --   ALBUMIN 3.9 3.5   No results for input(s): "LIPASE", "AMYLASE" in the last 168 hours. No results for input(s): "AMMONIA" in the last 168 hours. Coagulation profile Recent  Labs  Lab 09/03/23 1050  INR 1.9*    CBC: Recent Labs  Lab 09/03/23 1050 09/04/23 0618 09/05/23 0522 09/06/23 0457 09/06/23 1919 09/07/23 0529 09/08/23 0527  WBC 7.1 5.7 7.3 8.4  --  9.4 8.9  NEUTROABS 4.2  --   --   --   --   --  5.5  HGB 6.6* 7.1* 7.2* 7.0* 8.5* 8.2* 7.9*  HCT 23.4* 24.4* 26.0* 25.7* 30.2* 29.0* 28.1*  MCV 66.5* 68.7* 70.7* 71.2*  --  72.9* 73.6*  PLT 428* 342 307 315  --  278 304   Cardiac Enzymes: No results for input(s): "CKTOTAL", "CKMB", "CKMBINDEX", "TROPONINI" in the last 168 hours. BNP: Invalid input(s): "POCBNP" CBG: No results for input(s): "GLUCAP" in the last 168 hours. D-Dimer No results for input(s): "DDIMER" in the last 72 hours. Hgb A1c No results for input(s): "HGBA1C" in the last 72 hours. Lipid Profile No results for input(s): "CHOL", "HDL", "LDLCALC", "TRIG", "CHOLHDL", "LDLDIRECT" in the last 72 hours. Thyroid  function studies No results for input(s): "TSH", "T4TOTAL", "T3FREE", "THYROIDAB" in the last 72 hours.  Invalid input(s): "FREET3" Anemia work up No results for input(s): "VITAMINB12", "FOLATE", "FERRITIN", "TIBC", "IRON", "RETICCTPCT" in the last 72 hours. Microbiology No results found for this or any previous visit (from the past 240 hours).  Time coordinating discharge: 45 minutes  Signed: Rateel Beldin  [facility] 09/08/2023, 10:59 AM

## 2023-09-08 NOTE — Progress Notes (Signed)
 Subjective: No abdominal pain. No bleeding.  Objective: Vital signs in last 24 hours: Temp:  [98 F (36.7 C)-98.9 F (37.2 C)] 98 F (36.7 C) (05/29 0455) Pulse Rate:  [63-66] 63 (05/29 0948) Resp:  [16-20] 16 (05/29 0455) BP: (137-160)/(85-90) 146/90 (05/29 0948) SpO2:  [94 %-98 %] 95 % (05/29 0948) Weight change: 0.029 kg Last BM Date : 09/06/23  PE: GEN:  NAD ABD:  Soft, non-tender  Lab Results: CBC    Component Value Date/Time   WBC 8.9 09/08/2023 0527   RBC 3.82 (L) 09/08/2023 0527   HGB 7.9 (L) 09/08/2023 0527   HGB 6.6 (LL) 09/02/2023 1008   HCT 28.1 (L) 09/08/2023 0527   HCT 24.5 (L) 09/02/2023 1008   PLT 304 09/08/2023 0527   PLT 437 09/02/2023 1008   MCV 73.6 (L) 09/08/2023 0527   MCV 68 (L) 09/02/2023 1008   MCH 20.7 (L) 09/08/2023 0527   MCHC 28.1 (L) 09/08/2023 0527   RDW 22.3 (H) 09/08/2023 0527   RDW 16.2 (H) 09/02/2023 1008   LYMPHSABS 2.2 09/08/2023 0527   MONOABS 0.7 09/08/2023 0527   EOSABS 0.4 09/08/2023 0527   BASOSABS 0.1 09/08/2023 0527  CMP     Component Value Date/Time   NA 135 09/08/2023 0527   NA 140 09/02/2023 1008   K 3.7 09/08/2023 0527   CL 103 09/08/2023 0527   CO2 24 09/08/2023 0527   GLUCOSE 109 (H) 09/08/2023 0527   BUN 14 09/08/2023 0527   BUN 20 09/02/2023 1008   CREATININE 1.31 (H) 09/08/2023 0527   CALCIUM 8.4 (L) 09/08/2023 0527   PROT 7.7 09/03/2023 1050   PROT 7.1 12/09/2021 1029   ALBUMIN 3.5 09/06/2023 0457   ALBUMIN 4.3 12/09/2021 1029   AST 18 09/03/2023 1050   ALT 11 09/03/2023 1050   ALKPHOS 70 09/03/2023 1050   BILITOT 0.8 09/03/2023 1050   BILITOT 0.5 12/09/2021 1029   EGFR 45 (L) 09/02/2023 1008   GFRNONAA 60 (L) 09/08/2023 0527   Assessment:  Hematochezia.  Resolved.  Possibly hemorrhoidal. Anemia.  No clear finding on endoscopy or colonoscopy.  Plan:   Capsule endoscopy done, but there is no one available from our group to read today.  Given that patient is not bleeding and no precipitous  further drop in Hgb and since he will be off Xarelto  for the next few weeks, I feel comfortable sending patient home and we can have our weekend call doctor read the capsule this weekend.  And if something significant found, we can arrange appropriate follow-up care. [facility] will sign-off; please call with questions; we will make follow-up with me as outpatient; thanks for the consultation.   [name] 09/08/2023, 11:29 AM   Cell [phone] If no answer or after 5 PM call [phone]

## 2023-09-15 ENCOUNTER — Ambulatory Visit: Payer: Medicare (Managed Care)

## 2023-09-15 VITALS — BP 158/88 | HR 57 | Temp 97.9°F | Resp 22 | Ht 70.0 in | Wt 288.4 lb

## 2023-09-15 DIAGNOSIS — K921 Melena: Secondary | ICD-10-CM

## 2023-09-15 DIAGNOSIS — D500 Iron deficiency anemia secondary to blood loss (chronic): Secondary | ICD-10-CM

## 2023-09-15 DIAGNOSIS — K642 Third degree hemorrhoids: Secondary | ICD-10-CM

## 2023-09-15 DIAGNOSIS — D509 Iron deficiency anemia, unspecified: Secondary | ICD-10-CM

## 2023-09-15 MED ORDER — SODIUM CHLORIDE 0.9 % IV SOLN
510.0000 mg | Freq: Once | INTRAVENOUS | Status: AC
  Administered 2023-09-15: 510 mg via INTRAVENOUS
  Filled 2023-09-15: qty 17

## 2023-09-15 NOTE — Progress Notes (Signed)
 Diagnosis: Iron  Deficiency Anemia  Provider:  Praveen Mannam MD  Procedure: IV Infusion  IV Type: Peripheral, IV Location: L Antecubital  Venofer  (Iron  Sucrose), Dose: 200 mg  Infusion Start Time: 1547  Infusion Stop Time: 1605  Post Infusion IV Care: Observation period completed and Peripheral IV Discontinued  Discharge: Condition: Good, Destination: Home . AVS Declined  Performed by:  Star East, LPN

## 2023-09-19 ENCOUNTER — Ambulatory Visit: Payer: Medicare (Managed Care) | Admitting: Physician Assistant

## 2023-09-20 ENCOUNTER — Ambulatory Visit (HOSPITAL_COMMUNITY)
Admit: 2023-09-20 | Discharge: 2023-09-20 | Disposition: A | Discharge status: Home / Self Care | Payer: Medicare (Managed Care) | Attending: Physician Assistant | Admitting: Physician Assistant

## 2023-09-20 VITALS — BP 170/90 | HR 58 | Ht 70.0 in | Wt 286.8 lb

## 2023-09-20 DIAGNOSIS — I4819 Other persistent atrial fibrillation: Secondary | ICD-10-CM

## 2023-09-20 DIAGNOSIS — Z79899 Other long term (current) drug therapy: Secondary | ICD-10-CM | POA: Diagnosis not present

## 2023-09-20 DIAGNOSIS — D6869 Other thrombophilia: Secondary | ICD-10-CM

## 2023-09-20 DIAGNOSIS — I4891 Unspecified atrial fibrillation: Secondary | ICD-10-CM

## 2023-09-20 DIAGNOSIS — Z5181 Encounter for therapeutic drug level monitoring: Secondary | ICD-10-CM

## 2023-09-20 NOTE — Patient Instructions (Signed)
 Follow up with [facility] call for appt  [phone]

## 2023-09-20 NOTE — Progress Notes (Signed)
 Primary Care Physician: Pcp, No Primary Cardiologist: Dr Jacquelynn Matter  Primary Electrophysiologist: none Referring Physician: [facility]   Daryl [name] is a 68 y.o. male with a history of HTN, OSA, and atrial fibrillation who presents for follow up in the [facility].  The patient was initially diagnosed with atrial fibrillation 06/24/20 after presenting to the ED with symptoms of chest presure. ECG showed afib with RVR. He underwent DCCV at that time and his symptoms resolved. Patient was started on Eliquis  for stroke prevention, later transitioned to Xarelto . His echo showed EF 50%, mild MR. Patient is s/p repeat DCCV on 07/14/20 after starting flecainide .   At his visit on 09/02/23, patient reported that for the 1-2 weeks he noticed low BP readings 90s/60s on his home machine. He does have associated dizziness with positional change. He has also had some hemorrhoidal bleeding. Hgb at that time was 6.6 and he was sent to the ED for evaluation. CT abdomen and pelvis did not show any extravasation of blood but showed diverticulosis without evidence of diverticulitis. Xarelto  was held and GI consulted. No clear findings on colonoscopy or endoscopy. Suspected hemorrhoidal bleeding. Patient was given a total of 2 units of PRBC and IV iron .   Patient returns for follow up for atrial fibrillation. He remains in SR and is feeling much better. He continues to have small amounts of bleeding with bowel movements. Xarelto  is still on hold. He resumed lisinopril  yesterday due to high BP readings at home.   Today, he  denies symptoms of palpitations, chest pain, shortness of breath, orthopnea, PND, lower extremity edema, dizziness, presyncope, syncope, or neurologic sequela. The patient is tolerating medications without difficulties and is otherwise without complaint today.    Atrial Fibrillation Risk Factors:  he does have symptoms or diagnosis of sleep apnea. he does not have a  history of rheumatic fever. he does not have a history of alcohol use. The patient does not have a history of early familial atrial fibrillation or other arrhythmias.   Atrial Fibrillation Management history:  Previous antiarrhythmic drugs: flecainide   Previous cardioversions: 06/24/20, 07/14/20 Previous ablations: none Anticoagulation history: Eliquis    Past Medical History:  Diagnosis Date   A-fib (HCC)    Heartburn    HTN (hypertension)    OSA (obstructive sleep apnea)    Osteoarthritis    knee   Overweight    Sleep apnea    SOBOE (shortness of breath on exertion)    ROS- All systems are reviewed and negative except as per the HPI above.  Physical Exam: Vitals:   09/20/23 1543  BP: (!) 170/90  Pulse: (!) 58  Weight: 130.1 kg  Height: 5\' 10"  (1.778 m)     GEN: Well nourished, well developed in no acute distress CARDIAC: Regular rate and rhythm, no murmurs, rubs, gallops RESPIRATORY:  Clear to auscultation without rales, wheezing or rhonchi  ABDOMEN: Soft, non-tender, non-distended EXTREMITIES:  No edema; No deformity    Wt Readings from Last 3 Encounters:  09/20/23 130.1 kg  09/15/23 130.8 kg  09/07/23 122.5 kg    EKG today demonstrates  SB, 1st degree AV block Vent. rate 58 BPM PR interval 202 ms QRS duration 116 ms QT/QTcB 430/422 ms   Echo 09/04/23 demonstrated  1. Left ventricular ejection fraction, by estimation, is 60 to 65%. The  left ventricle has normal function. The left ventricle has no regional  wall motion abnormalities. There is moderate left ventricular hypertrophy.  Left ventricular diastolic parameters were grossly normal.   2. Right ventricular systolic function is normal. The right ventricular  size is normal. Tricuspid regurgitation signal is inadequate for assessing  PA pressure.   3. Left atrial size was mildly dilated.   4. The mitral valve is grossly normal. Mild to moderate mitral valve  regurgitation. No evidence of mitral  stenosis.   5. The aortic valve is tricuspid. Aortic valve regurgitation is trivial.  No aortic stenosis is present.   6. The inferior vena cava is dilated in size with <50% respiratory  variability, suggesting right atrial pressure of 15 mmHg.     Epic records are reviewed at length today  CHA2DS2-VASc Score = 2  The patient's score is based upon: CHF History: 0 HTN History: 1 Diabetes History: 0 Stroke History: 0 Vascular Disease History: 0 Age Score: 1 Gender Score: 0       ASSESSMENT AND PLAN: Persistent Atrial Fibrillation (ICD10:  I48.19) The patient's CHA2DS2-VASc score is 2, indicating a 2.2% annual risk of stroke.   Patient appears to be maintaining SR. Continue flecainide  100 mg BID Continue Toprol  25 mg BID  Secondary Hypercoagulable State (ICD10:  D68.69) The patient is at significant risk for stroke/thromboembolism based upon his CHA2DS2-VASc Score of 2.  However, Xarelto  is currently on hold. He was hospitalized for acute anemia requiring transfusion. Suspected hemorrhoidal bleeding, no clear findings on endoscopy and colonoscopy. Pill endoscopy also did not show any bleeding sources per patient report. Followed by [facility]. We discussed stroke prevention strategies. Agree with continuing to hold Xarelto  given intermittent ongoing bleeding. Could consider Watchman implant or ILR and monitoring for recurrent afib since his afib burden has been very low historically. Patient agreeable to consultation with EP to discuss options.   High Risk Medication Monitoring (ICD 10: Z79.899) Intervals on ECG acceptable for flecainide  monitoring.      Obesity Body mass index is 41.15 kg/m.  Encouraged lifestyle modification Followed by [facility]  OSA  Encouraged nightly CPAP Followed by [facility]  HTN Elevated today, patient resumed lisinopril  yesterday. If his BP does not improve after several days, will resume lisinopril -hydrochlorothiazide  combination.     Follow up with EP to establish care and discuss stroke prevention strategies.    Myrtha Ates PA-C [facility] [address] [place], [place] [address] [phone] 09/20/2023 4:31 PM

## 2023-09-22 ENCOUNTER — Ambulatory Visit: Payer: Medicare (Managed Care)

## 2023-09-22 ENCOUNTER — Encounter: Payer: Self-pay | Admitting: Physician Assistant

## 2023-09-22 ENCOUNTER — Ambulatory Visit (INDEPENDENT_AMBULATORY_CARE_PROVIDER_SITE_OTHER): Payer: Medicare (Managed Care) | Admitting: Physician Assistant

## 2023-09-22 VITALS — BP 151/79 | HR 63 | Temp 98.2°F | Resp 18 | Ht 70.0 in | Wt 285.8 lb

## 2023-09-22 DIAGNOSIS — K921 Melena: Secondary | ICD-10-CM

## 2023-09-22 DIAGNOSIS — M1711 Unilateral primary osteoarthritis, right knee: Secondary | ICD-10-CM

## 2023-09-22 DIAGNOSIS — M170 Bilateral primary osteoarthritis of knee: Secondary | ICD-10-CM | POA: Diagnosis not present

## 2023-09-22 DIAGNOSIS — D509 Iron deficiency anemia, unspecified: Secondary | ICD-10-CM

## 2023-09-22 DIAGNOSIS — K642 Third degree hemorrhoids: Secondary | ICD-10-CM

## 2023-09-22 DIAGNOSIS — D500 Iron deficiency anemia secondary to blood loss (chronic): Secondary | ICD-10-CM

## 2023-09-22 DIAGNOSIS — M1712 Unilateral primary osteoarthritis, left knee: Secondary | ICD-10-CM

## 2023-09-22 MED ORDER — SODIUM CHLORIDE 0.9 % IV SOLN
510.0000 mg | Freq: Once | INTRAVENOUS | Status: AC
  Administered 2023-09-22: 510 mg via INTRAVENOUS
  Filled 2023-09-22: qty 17

## 2023-09-22 MED ORDER — LIDOCAINE HCL 1 % IJ SOLN
4.0000 mL | INTRAMUSCULAR | Status: AC | PRN
  Administered 2023-09-22: 4 mL

## 2023-09-22 MED ORDER — METHYLPREDNISOLONE ACETATE 40 MG/ML IJ SUSP
40.0000 mg | INTRAMUSCULAR | Status: AC | PRN
  Administered 2023-09-22: 40 mg via INTRA_ARTICULAR

## 2023-09-22 NOTE — Progress Notes (Signed)
 Office Visit Note   Patient: Daryl [name]           Date of Birth: 1956-02-22           MRN: [id number] Visit Date: 09/22/2023              Requested by: No referring provider defined for this encounter. PCP: Pcp, No      HPI:  he is a patient of Dr. Arvella Bird.  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  Right is worse than left.  He spoke with Gregary Lean at his last visit about getting injections into his knees prior to a vacation.  Is here for those injections today.  He is anticipating at some point having knee replacement surgery starting with the right knee.  He is working on getting his weight loss goals.  Currently BMI is 41.1  Assessment & Plan: Visit Diagnoses:  1. Unilateral primary osteoarthritis, right knee   2. Unilateral primary osteoarthritis, left knee     Plan: Will go forward with bilateral steroid injections today may follow-up as needed with Buster Cash or Dr. Lucienne Ryder  Follow-Up Instructions: Return if symptoms worsen or fail to improve.   Ortho Exam  Patient is alert, oriented, no adenopathy, well-dressed, normal affect, normal respiratory effort. Bilateral knees no effusion no erythema compartments are soft and compressible he is neurovascularly intact    Imaging: No results found. No images are attached to the encounter.  Labs: Lab Results  Component Value Date   HGBA1C 6.2 (H) 12/09/2021     Lab Results  Component Value Date   ALBUMIN 3.5 09/06/2023   ALBUMIN 3.9 09/03/2023   ALBUMIN 3.9 09/13/2022    Lab Results  Component Value Date   MG 2.0 09/07/2023   MG 2.0 09/06/2023   MG 2.0 06/24/2020   Lab Results  Component Value Date   VD25OH 21.4 (L) 12/09/2021    No results found for: PREALBUMIN    Latest Ref Rng & Units 09/08/2023    5:27 AM 09/07/2023    5:29 AM 09/06/2023    7:19 PM  CBC EXTENDED  WBC 4.0 - 10.5 K/uL 8.9  9.4    RBC 4.22 - 5.81 MIL/uL 3.82  3.98    Hemoglobin 13.0 - 17.0 g/dL 7.9  8.2  8.5   HCT 53.6 -  52.0 % 28.1  29.0  30.2   Platelets 150 - 400 K/uL 304  278    NEUT# 1.7 - 7.7 K/uL 5.5     Lymph# 0.7 - 4.0 K/uL 2.2        There is no height or weight on file to calculate BMI.  Orders:  Orders Placed This Encounter  Procedures  . Large Joint Inj: bilateral knee   No orders of the defined types were placed in this encounter.    Procedures: Large Joint Inj: bilateral knee on 09/22/2023 8:43 AM Indications: pain and diagnostic evaluation Details: 25 G 1.5 in needle, anteromedial approach  Arthrogram: No  Medications (Right): 4 mL lidocaine  1 %; 40 mg methylPREDNISolone  acetate 40 MG/ML Medications (Left): 4 mL lidocaine  1 %; 40 mg methylPREDNISolone  acetate 40 MG/ML Outcome: tolerated well, no immediate complications Procedure, treatment alternatives, risks and benefits explained, specific risks discussed. Consent was given by the patient.     Clinical Data: No additional findings.  ROS:  All other systems negative, except as noted in the HPI. Review of Systems  Objective: Vital Signs: There were no vitals taken for this visit.  Specialty Comments:  No specialty comments available.  PMFS History: Patient Active Problem List   Diagnosis Date Noted  . AKI (acute kidney injury) (HCC) 09/04/2023  . Morbid obesity (HCC) 09/04/2023  . Iron  deficiency anemia 09/04/2023  . Diverticulosis 09/04/2023  . Blood loss anemia 09/03/2023  . Encounter for monitoring flecainide  therapy 05/26/2023  . Other fatigue 12/09/2021  . SOBOE (shortness of breath on exertion) 12/09/2021  . OSA (obstructive sleep apnea) 12/09/2021  . Essential hypertension 12/09/2021  . Vitamin D  deficiency 12/09/2021  . Anticoagulation management encounter 12/09/2021  . Hypercoagulable state due to persistent atrial fibrillation (HCC) 06/29/2021  . PAF (paroxysmal atrial fibrillation) (HCC) 07/01/2020  . Unilateral primary osteoarthritis, left knee 04/11/2019  . Unilateral primary osteoarthritis,  right knee 04/11/2019   Past Medical History:  Diagnosis Date  . A-fib (HCC)   . Heartburn   . HTN (hypertension)   . OSA (obstructive sleep apnea)   . Osteoarthritis    knee  . Overweight   . Sleep apnea   . SOBOE (shortness of breath on exertion)     Family History  Problem Relation Age of Onset  . Hypertension Mother   . Obesity Mother   . AAA (abdominal aortic aneurysm) Mother   . Heart disease Mother   . AAA (abdominal aortic aneurysm) Brother     Past Surgical History:  Procedure Laterality Date  . CARDIOVERSION N/A 07/14/2020   Procedure: CARDIOVERSION;  Surgeon: Jann Melody, MD;  Location: [facility] ENDOSCOPY;  Service: Cardiovascular;  Laterality: N/A;  . COLONOSCOPY Left 09/06/2023   Procedure: COLONOSCOPY;  Surgeon: Evangeline Hilts, MD;  Location: [facility] ENDOSCOPY;  Service: Gastroenterology;  Laterality: Left;  . ESOPHAGOGASTRODUODENOSCOPY Left 09/06/2023   Procedure: EGD (ESOPHAGOGASTRODUODENOSCOPY);  Surgeon: Evangeline Hilts, MD;  Location: [facility] ENDOSCOPY;  Service: Gastroenterology;  Laterality: Left;  . GIVENS CAPSULE STUDY N/A 09/07/2023   Procedure: IMAGING PROCEDURE, GI TRACT, INTRALUMINAL, VIA CAPSULE;  Surgeon: Evangeline Hilts, MD;  Location: [facility] ENDOSCOPY;  Service: Gastroenterology;  Laterality: N/A;   Social History   Occupational History  . Occupation: Teaching laboratory technician.    Comment: Engineer, water, [employer] consultant  Tobacco Use  . Smoking status: Never  . Smokeless tobacco: Never  . Tobacco comments:    Never smoke 06/29/21  Vaping Use  . Vaping status: Never Used  Substance and Sexual Activity  . Alcohol use: Not Currently    Alcohol/week: 1.0 - 2.0 standard drink of alcohol    Types: 1 - 2 Cans of beer per week    Comment: 1 non alcholic beer  . Drug use: Never  . Sexual activity: Not on file

## 2023-09-22 NOTE — Progress Notes (Signed)
 Diagnosis: Iron  Deficiency Anemia  Provider:  Praveen Mannam MD  Procedure: IV Infusion  IV Type: Peripheral, IV Location: L Hand  Feraheme (Ferumoxytol ), Dose: 510 mg  Infusion Start Time: 1548  Infusion Stop Time: 1604  Post Infusion IV Care: Observation period completed and Peripheral IV Discontinued  Discharge: Condition: Good, Destination: Home . AVS Declined  Performed by:  Haward Pope, RN

## 2023-11-08 ENCOUNTER — Ambulatory Visit (HOSPITAL_COMMUNITY): Payer: Medicare (Managed Care) | Admitting: Physician Assistant

## 2023-11-17 ENCOUNTER — Ambulatory Visit: Payer: Medicare (Managed Care) | Admitting: Physician Assistant

## 2023-11-23 ENCOUNTER — Inpatient Hospital Stay (HOSPITAL_COMMUNITY)
Admission: RE | Admit: 2023-11-23 | Payer: Medicare (Managed Care) | Source: Ambulatory Visit | Admitting: Physician Assistant

## 2023-12-31 ENCOUNTER — Other Ambulatory Visit: Payer: Self-pay

## 2023-12-31 ENCOUNTER — Encounter (HOSPITAL_BASED_OUTPATIENT_CLINIC_OR_DEPARTMENT_OTHER): Payer: Self-pay

## 2023-12-31 ENCOUNTER — Emergency Department (HOSPITAL_BASED_OUTPATIENT_CLINIC_OR_DEPARTMENT_OTHER)
Admission: EM | Admit: 2023-12-31 | Discharge: 2024-01-01 | Disposition: A | Discharge status: Home / Self Care | Payer: Medicare (Managed Care) | Attending: Emergency Medicine | Admitting: Emergency Medicine

## 2023-12-31 DIAGNOSIS — I119 Hypertensive heart disease without heart failure: Secondary | ICD-10-CM | POA: Diagnosis not present

## 2023-12-31 DIAGNOSIS — R0789 Other chest pain: Secondary | ICD-10-CM | POA: Diagnosis present

## 2023-12-31 DIAGNOSIS — Z79899 Other long term (current) drug therapy: Secondary | ICD-10-CM | POA: Insufficient documentation

## 2023-12-31 DIAGNOSIS — R600 Localized edema: Secondary | ICD-10-CM | POA: Insufficient documentation

## 2023-12-31 DIAGNOSIS — R7989 Other specified abnormal findings of blood chemistry: Secondary | ICD-10-CM | POA: Insufficient documentation

## 2023-12-31 DIAGNOSIS — R079 Chest pain, unspecified: Secondary | ICD-10-CM

## 2023-12-31 LAB — CBC
HCT: 40.4 % (ref 39.0–52.0)
Hemoglobin: 13.3 g/dL (ref 13.0–17.0)
MCH: 25.8 pg — ABNORMAL LOW (ref 26.0–34.0)
MCHC: 32.9 g/dL (ref 30.0–36.0)
MCV: 78.4 fL — ABNORMAL LOW (ref 80.0–100.0)
Platelets: 274 K/uL (ref 150–400)
RBC: 5.15 MIL/uL (ref 4.22–5.81)
RDW: 18 % — ABNORMAL HIGH (ref 11.5–15.5)
WBC: 7.9 K/uL (ref 4.0–10.5)
nRBC: 0 % (ref 0.0–0.2)

## 2023-12-31 NOTE — ED Provider Notes (Signed)
 Daryl [name] Provider Note   CSN: [id number] Arrival date & time: 12/31/23  2251     Patient presents with: Hypertension and Chest Pain   Daryl [name] is a 68 y.o. male.  {Add pertinent medical, surgical, social history, OB history to HPI:32947} 68 yo M with a chief complaints of chest discomfort.  He says he feels a little bit uncomfortable right at the top of his chest.  Nothing seems to make this better or worse.  Going on for a couple days now.  Denies trauma denies cough congestion or fever.  He thinks his blood pressure makes to go up.  He has been very worried about his blood pressure numbers.  Saw his family doctor for the same yesterday and had HCTZ added to lisinopril .  Has been on lisinopril  for some time and at some [name] was on HCTZ as well but had had improvement and was taken off.  Just started taking it again today.     Hypertension Associated symptoms include chest pain.  Chest Pain      Prior to Admission medications   Medication Sig Start Date End Date Taking? Authorizing Provider  acetaminophen  (TYLENOL ) 500 MG tablet Take 1,000 mg by mouth as needed for mild pain (pain score 1-3), moderate pain (pain score 4-6) or headache.    [provider]  AFRIN 12 HOUR 0.05 % nasal spray Place 1 spray into both nostrils 2 (two) times daily as needed for congestion.    [provider]  carbamide peroxide (DEBROX) 6.5 % OTIC solution Place 5 drops into both ears 2 (two) times daily. Patient taking differently: Place 5 drops into both ears as needed. 09/08/23   Daryl Reichert, MD  famotidine (PEPCID) 20 MG tablet Take 20 mg by mouth at bedtime as needed for heartburn or indigestion.    [provider]  flecainide  (TAMBOCOR ) 100 MG tablet TAKE ONE TABLET BY MOUTH TWICE DAILY 07/05/23   Fenton, Clint R, PA  lisinopril  (ZESTRIL ) 20 MG tablet Take 1 tablet (20 mg total) by mouth daily. Patient taking  differently: Take 20 mg by mouth daily. Taking as needed 09/02/23 09/01/24  Fenton, Clint R, PA  metoprolol  succinate (TOPROL -XL) 25 MG 24 hr tablet Take 1 tablet (25 mg total) by mouth 2 (two) times daily. 09/02/23   Fenton, Clint R, PA  NEOMYCIN -POLYMYXIN-HYDROCORTISONE (CORTISPORIN ) 1 % SOLN OTIC solution Place 1 drop into both ears daily as needed (itching). 11/20/21   [name], MD  PRESCRIPTION MEDICATION See admin instructions. BiPAP- At bedtime    [provider]  sodium chloride  (OCEAN) 0.65 % SOLN nasal spray Place 1 spray into both nostrils as needed for congestion.    [provider]  triamcinolone cream (KENALOG) 0.5 % Apply 1 Application topically 2 (two) times daily as needed (for itching). 02/07/23   [provider]    Allergies: Patient has no known allergies.    Review of Systems  Cardiovascular:  Positive for chest pain.    Updated Vital Signs BP (!) 215/104 (BP Location: Right Arm)   Pulse 61   Temp 98.1 F (36.7 C) (Oral)   Resp 12   Ht 5' 10 (1.778 m)   Wt 120.2 kg   SpO2 97%   BMI 38.02 kg/m   Physical Exam Vitals and nursing note reviewed.  Constitutional:      Appearance: He is well-developed.  HENT:     Head: Normocephalic and atraumatic.  Eyes:     Pupils: Pupils are equal, round, and reactive to light.  Neck:     Vascular: No JVD.  Cardiovascular:     Rate and Rhythm: Normal rate and regular rhythm.     Heart sounds: No murmur heard.    No friction rub. No gallop.  Pulmonary:     Effort: No respiratory distress.     Breath sounds: No wheezing.  Abdominal:     General: There is no distension.     Tenderness: There is no abdominal tenderness. There is no guarding or rebound.  Musculoskeletal:        General: Normal range of motion.     Cervical back: Normal range of motion and neck supple.  Skin:    Coloration: Skin is not pale.     Findings: No rash.  Neurological:     Mental Status: He is alert and  oriented to person, place, and time.  Psychiatric:        Behavior: Behavior normal.     (all labs ordered are listed, but only abnormal results are displayed) Labs Reviewed  BASIC METABOLIC PANEL WITH GFR  CBC  PRO BRAIN NATRIURETIC PEPTIDE  TROPONIN T, HIGH SENSITIVITY    EKG: EKG Interpretation Date/Time:  Saturday December 31 2023 23:02:40 EDT Ventricular Rate:  55 PR Interval:  200 QRS Duration:  126 QT Interval:  443 QTC Calculation: 424 R Axis:   26  Text Interpretation: Sinus rhythm Nonspecific intraventricular conduction delay No significant change since last tracing Confirmed by Emil Share [phone]) on 12/31/2023 11:05:50 PM  Radiology: No results found.  {Document cardiac monitor, telemetry assessment procedure when appropriate:32947} Procedures   Medications Ordered in the ED - No data to display    {Click here for ABCD2, HEART and other calculators REFRESH Note before signing:1}                              Medical Decision Making Amount and/or Complexity of Data Reviewed Labs: ordered.   68 yo M with a chief complaints of chest pain and high blood pressure.  Patient has been having symptoms for couple days.  He feels like when his blood pressure goes up he feels like he has a pressure to the top of his chest.  With symptoms going on for 2 days will obtain a single troponin.  He does have some lower extremity edema which is new over the past week will obtain a BMP.  Chest x-ray.  Reassess.  {Document critical care time when appropriate  Document review of labs and clinical decision tools ie CHADS2VASC2, etc  Document your independent review of radiology images and any outside records  Document your discussion with family members, caretakers and with consultants  Document social determinants of health affecting pt's care  Document your decision making why or why not admission, treatments were needed:32947:::1}   Final diagnoses:  None    ED Discharge  Orders     None

## 2023-12-31 NOTE — ED Triage Notes (Signed)
 Pt arrives POV from home for high bp, chest pressure x2 days. SBP in 200s. Denies HA,blurred vision, N/V. Pt reports seeing PCP yesterday with concerns of BP and chest pressure, PCP prescribed hydrochlorothiazide . Pt began taking today but BP still elevated and chest pressure remains the same. RR equal and unlabored, A/Ox4.

## 2024-01-01 ENCOUNTER — Emergency Department (HOSPITAL_BASED_OUTPATIENT_CLINIC_OR_DEPARTMENT_OTHER): Payer: Medicare (Managed Care)

## 2024-01-01 LAB — TROPONIN T, HIGH SENSITIVITY: Troponin T High Sensitivity: <15 ng/L (ref 0–19)

## 2024-01-01 LAB — BASIC METABOLIC PANEL WITH GFR
Anion gap: 10 (ref 5–15)
BUN: 13 mg/dL (ref 8–23)
CO2: 28 mmol/L (ref 22–32)
Calcium: 8.9 mg/dL (ref 8.9–10.3)
Chloride: 101 mmol/L (ref 98–111)
Creatinine, Ser: 1.09 mg/dL (ref 0.61–1.24)
GFR, Estimated: >60 mL/min (ref >60)
Glucose, Bld: 106 mg/dL — ABNORMAL HIGH (ref 70–99)
Potassium: 3.6 mmol/L (ref 3.5–5.1)
Sodium: 139 mmol/L (ref 135–145)

## 2024-01-01 LAB — PRO BRAIN NATRIURETIC PEPTIDE: Pro Brain Natriuretic Peptide: 163 pg/mL (ref <300.0)

## 2024-01-01 NOTE — Discharge Instructions (Signed)
 Please call your family doctor on Monday and let them know about your visit here.  Please return for sudden worsening chest pain especially upon exercising.  Return for sudden headache one-sided numbness or weakness or difficulty with speech or swallowing.

## 2024-01-18 ENCOUNTER — Other Ambulatory Visit (HOSPITAL_COMMUNITY): Payer: Self-pay | Admitting: Physician Assistant

## 2024-02-13 ENCOUNTER — Encounter: Payer: Self-pay | Admitting: Radiology

## 2024-02-14 ENCOUNTER — Ambulatory Visit (HOSPITAL_COMMUNITY): Payer: Medicare (Managed Care) | Admitting: Physician Assistant

## 2024-03-13 ENCOUNTER — Ambulatory Visit (HOSPITAL_COMMUNITY): Payer: Medicare (Managed Care) | Admitting: Physician Assistant

## 2024-04-16 ENCOUNTER — Ambulatory Visit (HOSPITAL_COMMUNITY)
Admission: RE | Admit: 2024-04-16 | Discharge: 2024-04-16 | Disposition: A | Discharge status: Home / Self Care | Payer: Medicare (Managed Care) | Source: Ambulatory Visit | Attending: Physician Assistant | Admitting: Physician Assistant

## 2024-04-16 VITALS — BP 106/76 | HR 111 | Ht 70.0 in | Wt 289.4 lb

## 2024-04-16 DIAGNOSIS — Z79899 Other long term (current) drug therapy: Secondary | ICD-10-CM | POA: Diagnosis not present

## 2024-04-16 DIAGNOSIS — Z5181 Encounter for therapeutic drug level monitoring: Secondary | ICD-10-CM

## 2024-04-16 DIAGNOSIS — D6869 Other thrombophilia: Secondary | ICD-10-CM | POA: Diagnosis not present

## 2024-04-16 DIAGNOSIS — I4891 Unspecified atrial fibrillation: Secondary | ICD-10-CM | POA: Diagnosis not present

## 2024-04-16 DIAGNOSIS — I4819 Other persistent atrial fibrillation: Secondary | ICD-10-CM

## 2024-04-16 MED ORDER — RIVAROXABAN 20 MG PO TABS: 20.0000 mg | ORAL_TABLET | Freq: Every day | ORAL | 4 refills | Status: AC

## 2024-04-16 NOTE — Patient Instructions (Addendum)
 Restart Xarelto  20mg  once a day with supper

## 2024-04-16 NOTE — Progress Notes (Signed)
 "   Primary Care Physician: Pcp, No Primary Cardiologist: Dr Dann  Primary Electrophysiologist: none Referring Physician: Darryle [name] ED   Daryl [name] is a 69 y.o. male with a history of HTN, OSA, and atrial fibrillation who presents for follow up in the [facility].  The patient was initially diagnosed with atrial fibrillation 06/24/20 after presenting to the ED with symptoms of chest presure. ECG showed afib with RVR. He underwent DCCV at that time and his symptoms resolved. Patient was started on Eliquis  for stroke prevention, later transitioned to Xarelto . His echo showed EF 50%, mild MR. Patient is s/p repeat DCCV on 07/14/20 after starting flecainide .   At his visit on 09/02/23, patient reported that for the 1-2 weeks he noticed low BP readings 90s/60s on his home machine. He does have associated dizziness with positional change. He has also had some hemorrhoidal bleeding. Hgb at that time was 6.6 and he was sent to the ED for evaluation. CT abdomen and pelvis did not show any extravasation of blood but showed diverticulosis without evidence of diverticulitis. Xarelto  was held and GI consulted. No clear findings on colonoscopy or endoscopy. Suspected hemorrhoidal bleeding. Patient was given a total of 2 units of PRBC and IV iron .   Patient returns for follow up for atrial fibrillation and flecainide  monitoring. He is in afib today. He states that he noted flutter in his chest as he was walking into his office visit today. He has not had any other palpitations for several months. He states that his hemorrhoidal bleeding is scant and infrequent.   Today, he  denies symptoms of chest pain, shortness of breath, orthopnea, PND, lower extremity edema, dizziness, presyncope, syncope, or neurologic sequela. The patient is tolerating medications without difficulties and is otherwise without complaint today.    Atrial Fibrillation Risk Factors:  he does have symptoms or  diagnosis of sleep apnea. he does not have a history of rheumatic fever. he does not have a history of alcohol use. The patient does not have a history of early familial atrial fibrillation or other arrhythmias.   Atrial Fibrillation Management history:  Previous antiarrhythmic drugs: flecainide   Previous cardioversions: 06/24/20, 07/14/20 Previous ablations: none Anticoagulation history: Eliquis , Xarelto     Past Medical History:  Diagnosis Date   A-fib (HCC)    Heartburn    HTN (hypertension)    OSA (obstructive sleep apnea)    Osteoarthritis    knee   Overweight    Sleep apnea    SOBOE (shortness of breath on exertion)    ROS- All systems are reviewed and negative except as per the HPI above.  Physical Exam: Vitals:   04/16/24 1502  BP: 106/76  Pulse: (!) 111  Weight: 131.3 kg  Height: 5' 10 (1.778 m)    GEN: Well nourished, well developed in no acute distress CARDIAC: Irregularly irregular rate and rhythm, no murmurs, rubs, gallops RESPIRATORY:  Clear to auscultation without rales, wheezing or rhonchi  ABDOMEN: Soft, non-tender, non-distended EXTREMITIES:  No edema; No deformity    Wt Readings from Last 3 Encounters:  04/16/24 131.3 kg  12/31/23 120.2 kg  09/22/23 129.6 kg    EKG Interpretation Date/Time:  Monday April 16 2024 15:15:49 EST Ventricular Rate:  111 PR Interval:    QRS Duration:  118 QT Interval:  350 QTC Calculation: 476 R Axis:   17  Text Interpretation: Atrial fibrillation Non-specific intra-ventricular conduction delay Abnormal ECG When compared with ECG of 31-Dec-2023 23:02, Atrial  fibrillation has replaced Sinus rhythm Confirmed by Daryl [name] ([id number]) on 04/16/2024 4:16:30 PM    Echo 09/04/23 demonstrated  1. Left ventricular ejection fraction, by estimation, is 60 to 65%. The  left ventricle has normal function. The left ventricle has no regional  wall motion abnormalities. There is moderate left ventricular hypertrophy.  Left  ventricular diastolic parameters were grossly normal.   2. Right ventricular systolic function is normal. The right ventricular  size is normal. Tricuspid regurgitation signal is inadequate for assessing  PA pressure.   3. Left atrial size was mildly dilated.   4. The mitral valve is grossly normal. Mild to moderate mitral valve  regurgitation. No evidence of mitral stenosis.   5. The aortic valve is tricuspid. Aortic valve regurgitation is trivial.  No aortic stenosis is present.   6. The inferior vena cava is dilated in size with <50% respiratory  variability, suggesting right atrial pressure of 15 mmHg.     Epic records are reviewed at length today  CHA2DS2-VASc Score = 2  The patient's score is based upon: CHF History: 0 HTN History: 1 Diabetes History: 0 Stroke History: 0 Vascular Disease History: 0 Age Score: 1 Gender Score: 0       ASSESSMENT AND PLAN: Persistent Atrial Fibrillation (ICD10:  I48.19) The patient's CHA2DS2-VASc score is 2, indicating a 2.2% annual risk of stroke.   Patient in afib today. Appears to have begun just prior to his office visit today.  Will resume Xarelto  20 mg daily. If he does not spontaneously convert, will plan for DCCV.  Continue flecainide  100 mg BID Continue Toprol  25 mg BID  Secondary Hypercoagulable State (ICD10:  D68.69) The patient is at significant risk for stroke/thromboembolism based upon his CHA2DS2-VASc Score of 2.  Start Rivaroxaban  (Xarelto ). Patient is not interested in pursuing [place] at this time.   High Risk Medication Monitoring (ICD 10: J342684) Patient requires ongoing monitoring for anti-arrhythmic medication which has the potential to cause life threatening arrhythmias. Intervals on ECG acceptable for flecainide  monitoring.      Obesity Body mass index is 41.52 kg/m.  Encouraged lifestyle modification  OSA  Encouraged nightly CPAP  HTN Stable on current regimen   Follow up in the AF clinic in 2  weeks.    Daril Kicks PA-C [facility] [address] [place], [place] [address] [phone] 04/16/2024 4:16 PM "

## 2024-04-17 ENCOUNTER — Other Ambulatory Visit (HOSPITAL_COMMUNITY): Payer: Self-pay | Admitting: Physician Assistant

## 2024-04-24 ENCOUNTER — Emergency Department (HOSPITAL_COMMUNITY)
Admission: EM | Admit: 2024-04-24 | Discharge: 2024-04-24 | Discharge status: Against Medical Advice | Payer: Medicare (Managed Care) | Attending: Emergency Medicine | Admitting: Emergency Medicine

## 2024-04-24 ENCOUNTER — Emergency Department (HOSPITAL_COMMUNITY): Payer: Medicare (Managed Care)

## 2024-04-24 DIAGNOSIS — I482 Chronic atrial fibrillation, unspecified: Secondary | ICD-10-CM | POA: Insufficient documentation

## 2024-04-24 DIAGNOSIS — Z5321 Procedure and treatment not carried out due to patient leaving prior to being seen by health care provider: Secondary | ICD-10-CM | POA: Insufficient documentation

## 2024-04-24 DIAGNOSIS — R002 Palpitations: Secondary | ICD-10-CM | POA: Insufficient documentation

## 2024-04-24 DIAGNOSIS — M25569 Pain in unspecified knee: Secondary | ICD-10-CM | POA: Diagnosis not present

## 2024-04-24 DIAGNOSIS — R5383 Other fatigue: Secondary | ICD-10-CM | POA: Insufficient documentation

## 2024-04-24 DIAGNOSIS — M549 Dorsalgia, unspecified: Secondary | ICD-10-CM | POA: Diagnosis not present

## 2024-04-24 DIAGNOSIS — R0789 Other chest pain: Secondary | ICD-10-CM | POA: Insufficient documentation

## 2024-04-24 LAB — CBC
HCT: 45.8 % (ref 39.0–52.0)
Hemoglobin: 15 g/dL (ref 13.0–17.0)
MCH: 27.4 pg (ref 26.0–34.0)
MCHC: 32.8 g/dL (ref 30.0–36.0)
MCV: 83.6 fL (ref 80.0–100.0)
Platelets: 329 K/uL (ref 150–400)
RBC: 5.48 MIL/uL (ref 4.22–5.81)
RDW: 13.7 % (ref 11.5–15.5)
WBC: 7.7 K/uL (ref 4.0–10.5)
nRBC: 0 % (ref 0.0–0.2)

## 2024-04-24 LAB — TROPONIN T, HIGH SENSITIVITY: Troponin T High Sensitivity: <15 ng/L (ref 0–19)

## 2024-04-24 LAB — BASIC METABOLIC PANEL WITH GFR
Anion gap: 14 (ref 5–15)
BUN: 16 mg/dL (ref 8–23)
CO2: 23 mmol/L (ref 22–32)
Calcium: 9.7 mg/dL (ref 8.9–10.3)
Chloride: 100 mmol/L (ref 98–111)
Creatinine, Ser: 1.21 mg/dL (ref 0.61–1.24)
GFR, Estimated: >60 mL/min
Glucose, Bld: 163 mg/dL — ABNORMAL HIGH (ref 70–99)
Potassium: 3.9 mmol/L (ref 3.5–5.1)
Sodium: 136 mmol/L (ref 135–145)

## 2024-04-24 NOTE — ED Triage Notes (Addendum)
 Patient has hx of afib, c/o palpitations that started yesterday, feeling fatigued, chest tightness. Patient is alert and oriented x 4. Airway patent, respirations even and unlabored. Skin normal, warm and dry.

## 2024-04-24 NOTE — ED Provider Triage Note (Cosign Needed)
 Emergency Medicine Provider Triage Evaluation Note  Daryl [name] , a 69 y.o. male  was evaluated in triage.  Pt complains of heart palpitation. Report noticing heart fluttering yesterday and feeling a bit tired.  No cp.  Hx of afib in the past, it went away but recently came back .  Have been taking his xarelto  for the past week.  No n/v/d.  Sts he has had increasing stress with his back pain and knees pain.  Review of Systems  Positive: As above Negative: As above  Physical Exam  BP 136/81 (BP Location: Right Arm)   Pulse 81   Temp 98.3 F (36.8 C) (Oral)   Resp 20   SpO2 98%  Gen:   Awake, no distress   Resp:  Normal effort  MSK:   Moves extremities without difficulty  Other:    Medical Decision Making  Medically screening exam initiated at 10:44 AM.  Appropriate orders placed.  Daryl [name] was informed that the remainder of the evaluation will be completed by another provider, this initial triage assessment does not replace that evaluation, and the importance of remaining in the ED until their evaluation is complete.     Nivia Colon, PA-C 04/24/24 1046

## 2024-04-24 NOTE — ED Notes (Signed)
 Pt states he has an appointment with his Primary and is leaving.

## 2024-04-25 ENCOUNTER — Ambulatory Visit (HOSPITAL_COMMUNITY)
Admission: RE | Admit: 2024-04-25 | Discharge: 2024-04-25 | Disposition: A | Discharge status: Home / Self Care | Payer: Medicare (Managed Care) | Source: Ambulatory Visit | Attending: Physician Assistant | Admitting: Physician Assistant

## 2024-04-25 ENCOUNTER — Ambulatory Visit (HOSPITAL_COMMUNITY)
Admit: 2024-04-25 | Discharge: 2024-04-25 | Disposition: A | Discharge status: Home / Self Care | Payer: Medicare (Managed Care) | Attending: Physician Assistant | Admitting: Physician Assistant

## 2024-04-25 VITALS — BP 128/88 | HR 81 | Ht 70.0 in | Wt 284.8 lb

## 2024-04-25 DIAGNOSIS — I4891 Unspecified atrial fibrillation: Secondary | ICD-10-CM

## 2024-04-25 DIAGNOSIS — I4819 Other persistent atrial fibrillation: Secondary | ICD-10-CM

## 2024-04-25 DIAGNOSIS — Z5181 Encounter for therapeutic drug level monitoring: Secondary | ICD-10-CM | POA: Diagnosis not present

## 2024-04-25 DIAGNOSIS — Z79899 Other long term (current) drug therapy: Secondary | ICD-10-CM | POA: Diagnosis not present

## 2024-04-25 DIAGNOSIS — D6869 Other thrombophilia: Secondary | ICD-10-CM

## 2024-04-25 NOTE — Patient Instructions (Signed)
 Wear monitor for 14 days and remove 05/09/24 and send in the mail

## 2024-04-25 NOTE — Progress Notes (Signed)
 "   Primary Care Physician: Sigrid Deidra Fox, MD Primary Cardiologist: Dr Dann  Primary Electrophysiologist: none Referring Physician: [name]   Daryl [name] is a 69 y.o. male with a history of HTN, OSA, and atrial fibrillation who presents for follow up in the [facility].  The patient was initially diagnosed with atrial fibrillation 06/24/20 after presenting to the ED with symptoms of chest presure. ECG showed afib with RVR. He underwent DCCV at that time and his symptoms resolved. Patient was started on Eliquis  for stroke prevention, later transitioned to Xarelto . His echo showed EF 50%, mild MR. Patient is s/p repeat DCCV on 07/14/20 after starting flecainide .   At his visit on 09/02/23, patient reported that for the 1-2 weeks he noticed low BP readings 90s/60s on his home machine. He does have associated dizziness with positional change. He has also had some hemorrhoidal bleeding. Hgb at that time was 6.6 and he was sent to the ED for evaluation. CT abdomen and pelvis did not show any extravasation of blood but showed diverticulosis without evidence of diverticulitis. Xarelto  was held and GI consulted. No clear findings on colonoscopy or endoscopy. Suspected hemorrhoidal bleeding. Patient was given a total of 2 units of PRBC and IV iron .   Patient returns for follow up for atrial fibrillation and flecainide  monitoring. He presented to the ED 04/24/24 with tachypalpitations and fatigue. ECG at that time showed SR. Troponin normal. He left the ED prior to being evaluated. He is in SR today but continues to have intermittent palpitations. ECG shows frequent PACs. He did have a small amount of hemorrhoidal bleeding after resuming Xarelto  but this has resolved.   Today, he  denies symptoms of chest pain, shortness of breath, orthopnea, PND, lower extremity edema, dizziness, presyncope, syncope, bleeding, or neurologic sequela. The patient is tolerating  medications without difficulties and is otherwise without complaint today.    Atrial Fibrillation Risk Factors:  he does have symptoms or diagnosis of sleep apnea. he does not have a history of rheumatic fever. he does not have a history of alcohol use. The patient does not have a history of early familial atrial fibrillation or other arrhythmias.   Atrial Fibrillation Management history:  Previous antiarrhythmic drugs: flecainide   Previous cardioversions: 06/24/20, 07/14/20 Previous ablations: none Anticoagulation history: Eliquis , Xarelto     Past Medical History:  Diagnosis Date   A-fib (HCC)    Heartburn    HTN (hypertension)    OSA (obstructive sleep apnea)    Osteoarthritis    knee   Overweight    Sleep apnea    SOBOE (shortness of breath on exertion)    ROS- All systems are reviewed and negative except as per the HPI above.  Physical Exam: Vitals:   04/25/24 0955  BP: 128/88  Pulse: 81  Weight: 129.2 kg  Height: 5' 10 (1.778 m)    GEN: Well nourished, well developed in no acute distress CARDIAC: Regular rate and rhythm with occasional ectopy, no murmurs, rubs, gallops RESPIRATORY:  Clear to auscultation without rales, wheezing or rhonchi  ABDOMEN: Soft, non-tender, non-distended EXTREMITIES:  No edema; No deformity    Wt Readings from Last 3 Encounters:  04/25/24 129.2 kg  04/16/24 131.3 kg  12/31/23 120.2 kg    EKG Interpretation Date/Time:  Wednesday April 25 2024 10:06:57 EST Ventricular Rate:  81 PR Interval:  196 QRS Duration:  122 QT Interval:  392 QTC Calculation: 455 R Axis:   21  Text  Interpretation: Sinus rhythm with Premature supraventricular complexes Non-specific intra-ventricular conduction delay Borderline ECG When compared with ECG of 24-Apr-2024 10:25, No significant change was found Confirmed by Hardeep Reetz ([id number]) on 04/25/2024 10:13:02 AM    Echo 09/04/23 demonstrated  1. Left ventricular ejection fraction, by estimation, is  60 to 65%. The  left ventricle has normal function. The left ventricle has no regional  wall motion abnormalities. There is moderate left ventricular hypertrophy.  Left ventricular diastolic parameters were grossly normal.   2. Right ventricular systolic function is normal. The right ventricular  size is normal. Tricuspid regurgitation signal is inadequate for assessing  PA pressure.   3. Left atrial size was mildly dilated.   4. The mitral valve is grossly normal. Mild to moderate mitral valve  regurgitation. No evidence of mitral stenosis.   5. The aortic valve is tricuspid. Aortic valve regurgitation is trivial.  No aortic stenosis is present.   6. The inferior vena cava is dilated in size with <50% respiratory  variability, suggesting right atrial pressure of 15 mmHg.     Epic records are reviewed at length today   CHA2DS2-VASc Score = 2  The patient's score is based upon: CHF History: 0 HTN History: 1 Diabetes History: 0 Stroke History: 0 Vascular Disease History: 0 Age Score: 1 Gender Score: 0       ASSESSMENT AND PLAN: Persistent Atrial Fibrillation (ICD10:  I48.19) The patient's CHA2DS2-VASc score is 2, indicating a 2.2% annual risk of stroke.   Patient in SR today but having frequent palpitations.  Will have him wear a 2 week Zio monitor to better assess his arrhythmia burden. If further rhythm control is needed, can consider increasing flecainide , changing to dofetilide, or ablation. His BMI is borderline for ablation.  Continue flecainide  100 mg BID Continue Toprol  25 mg BID  Secondary Hypercoagulable State (ICD10:  D68.69) The patient is at significant risk for stroke/thromboembolism based upon his CHA2DS2-VASc Score of 2.  Continue Rivaroxaban  (Xarelto ). No recent bleeding. He is not interested in [place] at this time.   High Risk Medication Monitoring (ICD 10: [id number]) Patient requires ongoing monitoring for anti-arrhythmic medication which has the potential  to cause life threatening arrhythmias. Intervals on ECG acceptable for flecainide  monitoring.      Obesity Body mass index is 40.86 kg/m.  Encouraged lifestyle modification  OSA  Encouraged nightly CPAP  HTN Stable on current regimen   Follow up in the AF clinic in one month.    Daril Kicks PA-C [facility] [address] [place], [place] [address] [phone] 04/25/2024 2:44 PM "

## 2024-04-30 ENCOUNTER — Ambulatory Visit (HOSPITAL_COMMUNITY): Payer: Medicare (Managed Care) | Admitting: Physician Assistant

## 2024-05-09 ENCOUNTER — Ambulatory Visit (HOSPITAL_COMMUNITY): Payer: Medicare (Managed Care) | Admitting: Physician Assistant

## 2024-05-16 ENCOUNTER — Ambulatory Visit (HOSPITAL_COMMUNITY): Payer: Self-pay | Admitting: Physician Assistant

## 2024-05-16 NOTE — Addendum Note (Signed)
 Encounter addended by: Franchot Glade RAMAN, RN on: 05/16/2024 9:20 AM  Actions taken: Imaging Exam ended

## 2024-05-23 ENCOUNTER — Ambulatory Visit (HOSPITAL_COMMUNITY): Payer: Medicare (Managed Care) | Admitting: Physician Assistant
